# Patient Record
Sex: Female | Born: 1990
Health system: Southern US, Community
[De-identification: ages and names within clinical notes are randomized; demographics above are authoritative.]

## PROBLEM LIST (undated history)

## (undated) DIAGNOSIS — O24419 Gestational diabetes mellitus in pregnancy, unspecified control: Secondary | ICD-10-CM

## (undated) DIAGNOSIS — F1110 Opioid abuse, uncomplicated: Secondary | ICD-10-CM

## (undated) DIAGNOSIS — C719 Malignant neoplasm of brain, unspecified: Secondary | ICD-10-CM

## (undated) HISTORY — DX: Opioid abuse, uncomplicated: F11.10

## (undated) HISTORY — PX: APPENDECTOMY: SHX54

## (undated) HISTORY — PX: OTHER SURGICAL HISTORY: SHX169

---

## 2016-06-21 DIAGNOSIS — R69 Illness, unspecified: Secondary | ICD-10-CM | POA: Diagnosis not present

## 2016-06-21 DIAGNOSIS — C719 Malignant neoplasm of brain, unspecified: Secondary | ICD-10-CM | POA: Diagnosis not present

## 2016-06-21 DIAGNOSIS — F909 Attention-deficit hyperactivity disorder, unspecified type: Secondary | ICD-10-CM | POA: Diagnosis not present

## 2016-06-22 DIAGNOSIS — Z79899 Other long term (current) drug therapy: Secondary | ICD-10-CM | POA: Diagnosis not present

## 2016-06-22 DIAGNOSIS — Z5181 Encounter for therapeutic drug level monitoring: Secondary | ICD-10-CM | POA: Diagnosis not present

## 2016-06-22 DIAGNOSIS — Z79891 Long term (current) use of opiate analgesic: Secondary | ICD-10-CM | POA: Diagnosis not present

## 2016-07-28 DIAGNOSIS — F419 Anxiety disorder, unspecified: Secondary | ICD-10-CM | POA: Diagnosis not present

## 2016-07-28 DIAGNOSIS — C719 Malignant neoplasm of brain, unspecified: Secondary | ICD-10-CM | POA: Diagnosis not present

## 2016-07-28 DIAGNOSIS — R69 Illness, unspecified: Secondary | ICD-10-CM | POA: Diagnosis not present

## 2016-08-24 DIAGNOSIS — K0252 Dental caries on pit and fissure surface penetrating into dentin: Secondary | ICD-10-CM | POA: Diagnosis not present

## 2016-08-24 DIAGNOSIS — K122 Cellulitis and abscess of mouth: Secondary | ICD-10-CM | POA: Diagnosis not present

## 2016-08-30 DIAGNOSIS — C719 Malignant neoplasm of brain, unspecified: Secondary | ICD-10-CM | POA: Diagnosis not present

## 2016-08-30 DIAGNOSIS — Z23 Encounter for immunization: Secondary | ICD-10-CM | POA: Diagnosis not present

## 2016-08-30 DIAGNOSIS — R69 Illness, unspecified: Secondary | ICD-10-CM | POA: Diagnosis not present

## 2016-09-30 DIAGNOSIS — R69 Illness, unspecified: Secondary | ICD-10-CM | POA: Diagnosis not present

## 2016-09-30 DIAGNOSIS — F909 Attention-deficit hyperactivity disorder, unspecified type: Secondary | ICD-10-CM | POA: Diagnosis not present

## 2016-09-30 DIAGNOSIS — C719 Malignant neoplasm of brain, unspecified: Secondary | ICD-10-CM | POA: Diagnosis not present

## 2016-11-22 DIAGNOSIS — L02211 Cutaneous abscess of abdominal wall: Secondary | ICD-10-CM | POA: Insufficient documentation

## 2016-11-22 DIAGNOSIS — F17210 Nicotine dependence, cigarettes, uncomplicated: Secondary | ICD-10-CM | POA: Insufficient documentation

## 2016-11-22 DIAGNOSIS — F1910 Other psychoactive substance abuse, uncomplicated: Secondary | ICD-10-CM | POA: Insufficient documentation

## 2016-11-23 ENCOUNTER — Emergency Department (HOSPITAL_COMMUNITY)
Admission: EM | Admit: 2016-11-23 | Discharge: 2016-11-23 | Disposition: A | Discharge status: Home / Self Care | Payer: Self-pay | Attending: Emergency Medicine | Admitting: Emergency Medicine

## 2016-11-23 ENCOUNTER — Encounter (HOSPITAL_COMMUNITY): Payer: Self-pay | Admitting: *Deleted

## 2016-11-23 DIAGNOSIS — L0291 Cutaneous abscess, unspecified: Secondary | ICD-10-CM

## 2016-11-23 HISTORY — DX: Malignant neoplasm of brain, unspecified: C71.9

## 2016-11-23 MED ORDER — OXYCODONE-ACETAMINOPHEN 5-325 MG PO TABS
1.0000 | ORAL_TABLET | ORAL | Status: DC | PRN
  Administered 2016-11-23: 1 via ORAL
  Filled 2016-11-23: qty 1

## 2016-11-23 MED ORDER — SULFAMETHOXAZOLE-TRIMETHOPRIM 800-160 MG PO TABS: 1.0000 | ORAL_TABLET | Freq: Two times a day (BID) | ORAL | 0 refills | Status: AC

## 2016-11-23 MED ORDER — BACITRACIN ZINC 500 UNIT/GM EX OINT
TOPICAL_OINTMENT | CUTANEOUS | Status: AC
  Filled 2016-11-23: qty 2.7

## 2016-11-23 MED ORDER — CEPHALEXIN 500 MG PO CAPS: 500.0000 mg | ORAL_CAPSULE | Freq: Four times a day (QID) | ORAL | 0 refills | Status: DC

## 2016-11-23 MED ORDER — LIDOCAINE HCL (PF) 1 % IJ SOLN
INTRAMUSCULAR | Status: AC
  Filled 2016-11-23: qty 5

## 2016-11-23 NOTE — ED Notes (Signed)
Pt also has a reddened area on her anterior FA.  Pt states it's from injecting.  She then reports she did the same thing in her LLQ where the abscess is.

## 2016-11-23 NOTE — Discharge Instructions (Signed)
Take the prescribed medication as directed.  Do warm compresses/soaks at home. Follow-up with the wellness clinic if you do not have a primary care doctor. Return to the ED for new or worsening symptoms.

## 2016-11-23 NOTE — ED Notes (Signed)
Pain medication given in Triage. Patient advised about side effects of medications and  to avoid driving for a minimum of 4 hours.  

## 2016-11-23 NOTE — ED Provider Notes (Signed)
[place] DEPT Provider Note   CSN: [id number] Arrival date & time: 11/22/16  2311     History   Chief Complaint Chief Complaint  Patient presents with  . Abscess    HPI Cindy Watts is a 26 y.o. female.  The history is provided by the patient and medical records.  Abscess     26 y.o. F here with abscess to LLQ.  States has been there for a few days now after injecting drugs.  Has hx of same.  Reports subjective fever, afebrile here.  Also notes some redness to left forearm from same.  No intervention tried at home.  Past Medical History:  Diagnosis Date  . Astrocytoma, grade II ([place])     There are no active problems to display for this patient.   Past Surgical History:  Procedure Laterality Date  . APPENDECTOMY    . brain surgeries      OB History    No data available       Home Medications    Prior to Admission medications   Not on File    Family History No family history on file.  Social History Social History   Tobacco Use  . Smoking status: Current Every Day Smoker    Packs/day: 0.50    Types: Cigarettes  . Smokeless tobacco: Never Used  Substance Use Topics  . Alcohol use: No    Frequency: Never  . Drug use: Yes    Types: Cocaine, Marijuana     Allergies   Patient has no known allergies.   Review of Systems Review of Systems  Skin:       Abscess  All other systems reviewed and are negative.    Physical Exam Updated Vital Signs BP 104/72 (BP Location: Right Arm)   Pulse 84   Temp 98.2 F (36.8 C) (Oral)   Resp 16   Ht 5\' 2"  (1.575 m)   Wt 52.2 kg (115 lb)   LMP 11/09/2016   SpO2 98%   BMI 21.03 kg/m   Physical Exam  Constitutional: She is oriented to person, place, and time. She appears well-developed and well-nourished.  HENT:  Head: Normocephalic and atraumatic.  Mouth/Throat: Oropharynx is clear and moist.  Eyes: Conjunctivae and EOM are normal. Pupils are equal, round,  and reactive to light.  Neck: Normal range of motion.  Cardiovascular: Normal rate, regular rhythm and normal heart sounds.  Pulmonary/Chest: Effort normal and breath sounds normal. No stridor. No respiratory distress.  Abdominal: Soft. Bowel sounds are normal.  3cm abscess to left lower abdominal pain, surrounding erythema without induration; abscess is fluctuant without active drainage  Musculoskeletal: Normal range of motion.  Small amount of erythema to the ulnar aspect of left forearm, there is no appreciable abscess, induration, or lymphangitis; there is a small scab noted, no active bleeding or drainage  Neurological: She is alert and oriented to person, place, and time.  Skin: Skin is warm and dry.  Psychiatric: She has a normal mood and affect.  Nursing note and vitals reviewed.    ED Treatments / Results  Labs (all labs ordered are listed, but only abnormal results are displayed) Labs Reviewed - No data to display  EKG  EKG Interpretation None       Radiology No results found.  Procedures Procedures (including critical care time)  INCISION AND DRAINAGE Performed by: Larene Pickett Consent: Verbal consent obtained. Risks and benefits: risks, benefits and alternatives were discussed Type:  abscess  Body area: left lower abdomen  Anesthesia: local infiltration  Incision was made with a scalpel.  Local anesthetic: lidocaine 1% without epinephrine  Anesthetic total: 3 ml  Complexity: complex Blunt dissection to break up loculations  Drainage: purulent, foul smelling  Drainage amount: large  Packing material: none (patient would not tolerate)  Patient tolerance: Patient tolerated the procedure well with no immediate complications.     Medications Ordered in ED Medications  oxyCODONE-acetaminophen (PERCOCET/ROXICET) 5-325 MG per tablet 1 tablet (1 tablet Oral Given 11/23/16 0107)  lidocaine (PF) (XYLOCAINE) 1 % injection (not administered)    bacitracin 500 UNIT/GM ointment (not administered)     Initial Impression / Assessment and Plan / ED Course  I have reviewed the triage vital signs and the nursing notes.  Pertinent labs & imaging results that were available during my care of the patient were reviewed by me and considered in my medical decision making (see chart for details).  27 year old female here with abscess of left lower abdomen from IV drug use.  Reports subjective fever but afebrile and nontoxic in appearance here.  She has no systemic symptoms concerning for sepsis.  No murmur heard on exam.  I&D performed, patient did not tolerate all that well and the procedure was stopped early.  There was a large amount of pus expressed but some still present within, patient would not allow packing.  Give her history of IVDU, will start on abx.  Discussed home wound care, warm compresses.  I did discuss the harmful effects of IVDU on her body including worsening infection, she acknowledged these risks.  Patient was discharged home in stable condition.  Final Clinical Impressions(s) / ED Diagnoses   Final diagnoses:  Abscess    ED Discharge Orders        Ordered    sulfamethoxazole-trimethoprim (BACTRIM DS,SEPTRA DS) 800-160 MG tablet  2 times daily     11/23/16 0515    cephALEXin (KEFLEX) 500 MG capsule  4 times daily     11/23/16 0515       Larene Pickett, PA-C 11/23/16 3893    Randal Buba, April, MD 11/23/16 [phone]

## 2018-02-10 ENCOUNTER — Emergency Department (HOSPITAL_COMMUNITY)
Admission: EM | Admit: 2018-02-10 | Discharge: 2018-02-10 | Disposition: A | Discharge status: Home / Self Care | Payer: Medicare HMO | Attending: Emergency Medicine | Admitting: Emergency Medicine

## 2018-02-10 ENCOUNTER — Encounter (HOSPITAL_COMMUNITY): Payer: Self-pay | Admitting: *Deleted

## 2018-02-10 ENCOUNTER — Emergency Department (HOSPITAL_COMMUNITY): Payer: Medicare HMO

## 2018-02-10 DIAGNOSIS — R509 Fever, unspecified: Secondary | ICD-10-CM | POA: Diagnosis present

## 2018-02-10 DIAGNOSIS — J101 Influenza due to other identified influenza virus with other respiratory manifestations: Secondary | ICD-10-CM | POA: Diagnosis not present

## 2018-02-10 DIAGNOSIS — F17210 Nicotine dependence, cigarettes, uncomplicated: Secondary | ICD-10-CM | POA: Insufficient documentation

## 2018-02-10 LAB — INFLUENZA PANEL BY PCR (TYPE A & B)
INFLAPCR: POSITIVE — AB
INFLBPCR: NEGATIVE

## 2018-02-10 LAB — GROUP A STREP BY PCR: GROUP A STREP BY PCR: NOT DETECTED

## 2018-02-10 MED ORDER — HYDROCOD POLST-CPM POLST ER 10-8 MG/5ML PO SUER
5.0000 mL | Freq: Once | ORAL | Status: AC
  Administered 2018-02-10: 5 mL via ORAL
  Filled 2018-02-10: qty 5

## 2018-02-10 MED ORDER — HYDROCOD POLST-CPM POLST ER 10-8 MG/5ML PO SUER: 5.0000 mL | Freq: Two times a day (BID) | ORAL | 0 refills | Status: DC | PRN

## 2018-02-10 NOTE — ED Notes (Signed)
Bed: WTR7 Expected date:  Expected time:  Means of arrival:  Comments: 

## 2018-02-10 NOTE — ED Triage Notes (Signed)
Pt complains of sore throat, cough, nasal congestion, body aches x 4 days. Pt returned from [place] last night.

## 2018-02-10 NOTE — ED Provider Notes (Signed)
[facility] DEPT Provider Note   CSN: [id number] Arrival date & time: 02/10/18  1526     History   Chief Complaint Chief Complaint  Patient presents with  . URI    HPI Cindy Watts is a 28 y.o. female every day smoker with hx of Astrocytoma  who presents to the ED with flu like symptoms. Patient reports fever, chills, cough, congestion and body aches x 4 days. Taking OTC medications without relief.   The history is provided by the patient. No language interpreter was used.  URI  Presenting symptoms: congestion, cough, ear pain, fatigue, fever and sore throat   Presenting symptoms: no facial pain   Severity:  Moderate Onset quality:  Gradual Duration:  4 days Timing:  Constant Progression:  Worsening Chronicity:  New Relieved by:  Nothing Worsened by:  Nothing Ineffective treatments:  OTC medications Associated symptoms: myalgias   Associated symptoms: no headaches and no wheezing   Risk factors: sick contacts     Past Medical History:  Diagnosis Date  . Astrocytoma, grade II ([place])     There are no active problems to display for this patient.   Past Surgical History:  Procedure Laterality Date  . APPENDECTOMY    . brain surgeries       OB History    Gravida  1   Para      Term      Preterm      AB      Living        SAB      TAB      Ectopic      Multiple      Live Births               Home Medications    Prior to Admission medications   Medication Sig Start Date End Date Taking? Authorizing Provider  chlorpheniramine-HYDROcodone (TUSSIONEX PENNKINETIC ER) 10-8 MG/5ML SUER Take 5 mLs by mouth every 12 (twelve) hours as needed for cough. 02/10/18   Ashley Murrain, NP    Family History No family history on file.  Social History Social History   Tobacco Use  . Smoking status: Current Every Day Smoker    Packs/day: 0.50    Types: Cigarettes  . Smokeless tobacco: Never Used  Substance Use  Topics  . Alcohol use: No    Frequency: Never  . Drug use: Yes    Types: Cocaine, Marijuana     Allergies   Patient has no known allergies.   Review of Systems Review of Systems  Constitutional: Positive for fatigue and fever.  HENT: Positive for congestion, ear pain, sinus pressure and sore throat.   Eyes: Negative for discharge, redness and itching.  Respiratory: Positive for cough. Negative for shortness of breath and wheezing.   Cardiovascular: Negative for chest pain.  Gastrointestinal: Negative for abdominal pain, diarrhea, nausea and vomiting.  Genitourinary: Positive for decreased urine volume. Negative for dysuria.  Musculoskeletal: Positive for myalgias.  Skin: Negative for rash.  Neurological: Negative for headaches.  Psychiatric/Behavioral: Negative for confusion.     Physical Exam Updated Vital Signs BP 105/74   Pulse 86   Temp 98.5 F (36.9 C) (Oral)   Resp 18   LMP 12/15/2017   SpO2 97%   Physical Exam Vitals signs and nursing note reviewed.  Constitutional:      General: She is not in acute distress.    Appearance: Normal appearance. She is well-developed.  HENT:  Head: Normocephalic and atraumatic.     Right Ear: Tympanic membrane normal.     Left Ear: Tympanic membrane normal.     Nose: Congestion present.     Mouth/Throat:     Pharynx: Posterior oropharyngeal erythema present.  Eyes:     Extraocular Movements: Extraocular movements intact.     Conjunctiva/sclera: Conjunctivae normal.  Neck:     Musculoskeletal: Normal range of motion and neck supple. No neck rigidity.  Cardiovascular:     Rate and Rhythm: Normal rate and regular rhythm.  Pulmonary:     Effort: Pulmonary effort is normal. No respiratory distress.     Breath sounds: No wheezing or rales.  Abdominal:     Palpations: Abdomen is soft.     Tenderness: There is no abdominal tenderness.  Musculoskeletal: Normal range of motion.  Lymphadenopathy:     Cervical: No cervical  adenopathy.  Skin:    General: Skin is warm and dry.  Neurological:     Mental Status: She is alert and oriented to person, place, and time.     Cranial Nerves: No cranial nerve deficit.  Psychiatric:        Mood and Affect: Mood normal.      ED Treatments / Results  Labs (all labs ordered are listed, but only abnormal results are displayed) Labs Reviewed  INFLUENZA PANEL BY PCR (TYPE A & B) - Abnormal; Notable for the following components:      Result Value   Influenza A By PCR POSITIVE (*)    All other components within normal limits  GROUP A STREP BY PCR   Radiology Dg Chest 2 View  Result Date: 02/10/2018 CLINICAL DATA:  Cough, fever and body aches for 4 days. EXAM: CHEST - 2 VIEW COMPARISON:  None. FINDINGS: The cardiomediastinal silhouette is unremarkable. There is no evidence of focal airspace disease, pulmonary edema, suspicious pulmonary nodule/mass, pleural effusion, or pneumothorax. No acute bony abnormalities are identified. IMPRESSION: No active cardiopulmonary disease. Electronically Signed   By: Margarette Canada M.D.   On: 02/10/2018 16:59    Procedures Procedures (including critical care time)  Medications Ordered in ED Medications  chlorpheniramine-HYDROcodone (TUSSIONEX) 10-8 MG/5ML suspension 5 mL (5 mLs Oral Given 02/10/18 1803)     Initial Impression / Assessment and Plan / ED Course  I have reviewed the triage vital signs and the nursing notes. SUBJECTIVE:  Cindy Watts is a 28 y.o. female who present complaining of flu-like symptoms: fevers, chills, myalgias, congestion, sore throat and cough for 4 days. Denies dyspnea or wheezing.  OBJECTIVE: Appears moderately ill but not toxic; temperature as noted in vitals. Ears normal. Throat and pharynx normal.  Neck supple. No adenopathy in the neck. Sinuses non tender. The chest is clear.  ASSESSMENT: Influenza A  PLAN: Symptomatic therapy suggested: rest, increase fluids, gargle prn for sore throat,  tylenol and motrin,use mist of vaporizer prn. Return precautions discussed. Will Rx Tussionex for cough. Will not start Tamiflu since symptoms started 4 days ago. Patient to f/u with PCP or return here for worsening symptoms.   Final Clinical Impressions(s) / ED Diagnoses   Final diagnoses:  Influenza A    ED Discharge Orders         Ordered    chlorpheniramine-HYDROcodone (TUSSIONEX PENNKINETIC ER) 10-8 MG/5ML SUER  Every 12 hours PRN     02/10/18 [place], [place], NP 02/10/18 1831    Tegeler, Gwenyth Allegra,  MD 02/10/18 2326

## 2018-02-10 NOTE — Discharge Instructions (Addendum)
Take tylenol and ibuprofen as needed for fever and body aches. Do not drive while taking the cough medication as it will make you sleepy. Rest, drink plenty of fluids so you do not get dehydrated. Return for worsening symptoms.

## 2018-05-17 ENCOUNTER — Encounter: Payer: Self-pay | Admitting: Certified Nurse Midwife

## 2018-05-17 ENCOUNTER — Other Ambulatory Visit (HOSPITAL_COMMUNITY)
Admission: RE | Admit: 2018-05-17 | Discharge: 2018-05-17 | Disposition: A | Discharge status: Home / Self Care | Payer: Medicare PPO | Source: Ambulatory Visit | Attending: Certified Nurse Midwife | Admitting: Certified Nurse Midwife

## 2018-05-17 ENCOUNTER — Other Ambulatory Visit: Payer: Self-pay

## 2018-05-17 ENCOUNTER — Ambulatory Visit (INDEPENDENT_AMBULATORY_CARE_PROVIDER_SITE_OTHER): Payer: Medicare PPO | Admitting: Certified Nurse Midwife

## 2018-05-17 VITALS — BP 108/67 | HR 83 | Wt 125.8 lb

## 2018-05-17 DIAGNOSIS — O0930 Supervision of pregnancy with insufficient antenatal care, unspecified trimester: Secondary | ICD-10-CM

## 2018-05-17 DIAGNOSIS — O99330 Smoking (tobacco) complicating pregnancy, unspecified trimester: Secondary | ICD-10-CM

## 2018-05-17 DIAGNOSIS — N898 Other specified noninflammatory disorders of vagina: Secondary | ICD-10-CM

## 2018-05-17 DIAGNOSIS — Z3480 Encounter for supervision of other normal pregnancy, unspecified trimester: Secondary | ICD-10-CM | POA: Diagnosis present

## 2018-05-17 DIAGNOSIS — Z3A22 22 weeks gestation of pregnancy: Secondary | ICD-10-CM

## 2018-05-17 DIAGNOSIS — O26892 Other specified pregnancy related conditions, second trimester: Secondary | ICD-10-CM

## 2018-05-17 NOTE — Progress Notes (Signed)
Pt presents for NOB visit transferring from [facility] This is planned pregnancy and FOB is involved.  Needs PN labs done today. Pap completed 02/2018.

## 2018-05-17 NOTE — Patient Instructions (Signed)
Glucose Tolerance Test During Pregnancy Why am I having this test? The glucose tolerance test (GTT) is done to check how your body processes sugar (glucose). This is one of several tests used to diagnose diabetes that develops during pregnancy (gestational diabetes mellitus). Gestational diabetes is a temporary form of diabetes that some women develop during pregnancy. It usually occurs during the second trimester of pregnancy and goes away after delivery. Testing (screening) for gestational diabetes usually occurs between 24 and 28 weeks of pregnancy. You may have the GTT test after having a 1-hour glucose screening test if the results from that test indicate that you may have gestational diabetes. You may also have this test if:  You have a history of gestational diabetes.  You have a history of giving birth to very large babies or have experienced repeated fetal loss (stillbirth).  You have signs and symptoms of diabetes, such as: ? Changes in your vision. ? Tingling or numbness in your hands or feet. ? Changes in hunger, thirst, and urination that are not otherwise explained by your pregnancy. What is being tested? This test measures the amount of glucose in your blood at different times during a period of 3 hours. This indicates how well your body is able to process glucose. What kind of sample is taken?  Blood samples are required for this test. They are usually collected by inserting a needle into a blood vessel. How do I prepare for this test?  For 3 days before your test, eat normally. Have plenty of carbohydrate-rich foods.  Follow instructions from your health care provider about: ? Eating or drinking restrictions on the day of the test. You may be asked to not eat or drink anything other than water (fast) starting 8-10 hours before the test. ? Changing or stopping your regular medicines. Some medicines may interfere with this test. Tell a health care provider about:  All  medicines you are taking, including vitamins, herbs, eye drops, creams, and over-the-counter medicines.  Any blood disorders you have.  Any surgeries you have had.  Any medical conditions you have. What happens during the test? First, your blood glucose will be measured. This is referred to as your fasting blood glucose, since you fasted before the test. Then, you will drink a glucose solution that contains a certain amount of glucose. Your blood glucose will be measured again 1, 2, and 3 hours after drinking the solution. This test takes about 3 hours to complete. You will need to stay at the testing location during this time. During the testing period:  Do not eat or drink anything other than the glucose solution.  Do not exercise.  Do not use any products that contain nicotine or tobacco, such as cigarettes and e-cigarettes. If you need help stopping, ask your health care provider. The testing procedure may vary among health care providers and hospitals. How are the results reported? Your results will be reported as milligrams of glucose per deciliter of blood (mg/dL) or millimoles per liter (mmol/L). Your health care provider will compare your results to normal ranges that were established after testing a large group of people (reference ranges). Reference ranges may vary among labs and hospitals. For this test, common reference ranges are:  Fasting: less than 95-105 mg/dL (5.3-5.8 mmol/L).  1 hour after drinking glucose: less than 180-190 mg/dL (10.0-10.5 mmol/L).  2 hours after drinking glucose: less than 155-165 mg/dL (8.6-9.2 mmol/L).  3 hours after drinking glucose: 140-145 mg/dL (7.8-8.1 mmol/L). What do the   results mean? Results within reference ranges are considered normal, meaning that your glucose levels are well-controlled. If two or more of your blood glucose levels are high, you may be diagnosed with gestational diabetes. If only one level is high, your health care  provider may suggest repeat testing or other tests to confirm a diagnosis. Talk with your health care provider about what your results mean. Questions to ask your health care provider Ask your health care provider, or the department that is doing the test:  When will my results be ready?  How will I get my results?  What are my treatment options?  What other tests do I need?  What are my next steps? Summary  The glucose tolerance test (GTT) is one of several tests used to diagnose diabetes that develops during pregnancy (gestational diabetes mellitus). Gestational diabetes is a temporary form of diabetes that some women develop during pregnancy.  You may have the GTT test after having a 1-hour glucose screening test if the results from that test indicate that you may have gestational diabetes. You may also have this test if you have any symptoms or risk factors for gestational diabetes.  Talk with your health care provider about what your results mean. This information is not intended to replace advice given to you by your health care provider. Make sure you discuss any questions you have with your health care provider. Document Released: 06/28/2011 Document Revised: 08/08/2016 Document Reviewed: 08/08/2016 Elsevier Interactive Patient Education  2019 Elsevier Inc.  

## 2018-05-17 NOTE — Progress Notes (Signed)
History:   Cindy Watts is a 28 y.o. [phone] at [redacted]w[redacted]d by LMP being seen today for her first obstetrical visit.  Her obstetrical history is significant for C/S and late to prenatal care. Patient does intend to breast feed and formula feed. Pregnancy history fully reviewed.  Patient reports vaginal discharge. Patient reports white thin discharge with an odor, unsure if she has a vaginal infection or UTI.       HISTORY: OB History  Gravida Para Term Preterm AB Living  4 3 3  0 0 3  SAB TAB Ectopic Multiple Live Births  0 0 0 0 3    # Outcome Date GA Lbr Len/2nd Weight Sex Delivery Anes PTL Lv  4 Current           3 Term 10/22/08 [redacted]w[redacted]d   F CS-LTranv   LIV  2 Term 10/01/07 [redacted]w[redacted]d   M CS-LTranv EPI  LIV  1 Term 12/11/05 [redacted]w[redacted]d   F CS-LTranv Spinal  LIV     Complications: Anesthetic Complications    Last pap smear was done 02/11/18 and was normal  Past Medical History:  Diagnosis Date  . Astrocytoma, grade II [facility])    Past Surgical History:  Procedure Laterality Date  . APPENDECTOMY    . brain surgeries     x3   History reviewed. No pertinent family history. Social History   Tobacco Use  . Smoking status: Current Every Day Smoker    Packs/day: 0.50    Types: Cigarettes  . Smokeless tobacco: Never Used  Substance Use Topics  . Alcohol use: No    Frequency: Never  . Drug use: Not Currently    Types: Cocaine, Marijuana   No Known Allergies Current Outpatient Medications on File Prior to Visit  Medication Sig Dispense Refill  . Prenatal Vit w/Fe-Methylfol-FA (PNV PO) Take by mouth.    . chlorpheniramine-HYDROcodone (TUSSIONEX PENNKINETIC ER) 10-8 MG/5ML SUER Take 5 mLs by mouth every 12 (twelve) hours as needed for cough. 115 mL 0   No current facility-administered medications on file prior to visit.     Review of Systems Pertinent items noted in HPI and remainder of comprehensive ROS otherwise negative. Physical Exam:   Vitals:   05/17/18 1508  BP: 108/67   Pulse: 83  Weight: 125 lb 12.8 oz (57.1 kg)   Fetal Heart Rate (bpm): 151 Uterus:  Fundal Height: 23 cm  Pelvic Exam: Perineum: no hemorrhoids, normal perineum   Vulva: normal external genitalia, no lesions   Vagina:  normal mucosa, normal discharge on vaginal introitus    Bony Pelvis: average  System: General: well-developed, well-nourished female in no acute distress   Skin: normal coloration and turgor, no rashes   Neurologic: oriented, normal, negative, normal mood   Extremities: normal strength, tone, and muscle mass, ROM of all joints is normal   HEENT PERRLA, extraocular movement intact and sclera clear   Mouth/Teeth mucous membranes moist, pharynx normal without lesions    Neck supple and no masses   Cardiovascular: regular rate and rhythm   Respiratory:  no respiratory distress, normal breath sounds   Abdomen: soft, non-tender; gravid appropriate for gestational age      Assessment:    Pregnancy: [id number] Patient Active Problem List   Diagnosis Date Noted  . Supervision of other normal pregnancy, antepartum 05/17/2018  . Late prenatal care, antepartum 05/17/2018  . Tobacco use during pregnancy, antepartum 05/17/2018     Plan:    1. Supervision of other normal  pregnancy, antepartum - Routine prenatal care  - Anticipatory guidance on upcoming appointments with next appointment being GTT, discussed fasting prior to appointment  - Babyscripts Schedule Optimization - Obstetric Panel, Including HIV - Culture, OB Urine - Cervicovaginal ancillary only( [place]) - [place] MFM OB COMP + 14 WK; Future - Pain Mgmt, Profile 6 Conf w/o mM, U  2. Late prenatal care, antepartum - Care initiated at 22 weeks   3. Tobacco use during pregnancy, antepartum - Reports everyday smoker, denies use of marijuana  - Educated and discussed risk of smoking during pregnancy and risks to fetus including IUGR and/or fetal demise   Initial labs drawn. Continue prenatal vitamins. Genetic  Screening discussed, NIPS: undecided. Ultrasound discussed; fetal anatomic survey: ordered. Problem list reviewed and updated. The nature of [facility] with multiple MDs and other Advanced Practice Providers was explained to patient; also emphasized that residents, students are part of our team. Routine obstetric precautions reviewed. Return in about 5 weeks (around 06/21/2018) for ROB/2hrGTT.     Lajean Manes, [place] for [employer], [place]

## 2018-05-18 LAB — CERVICOVAGINAL ANCILLARY ONLY
Bacterial vaginitis: POSITIVE — AB
Candida vaginitis: POSITIVE — AB
Chlamydia: NEGATIVE
Neisseria Gonorrhea: NEGATIVE
Trichomonas: NEGATIVE

## 2018-05-21 MED ORDER — TERCONAZOLE 0.8 % VA CREA: 1.0000 | TOPICAL_CREAM | Freq: Every day | VAGINAL | 0 refills | Status: AC

## 2018-05-21 MED ORDER — METRONIDAZOLE 500 MG PO TABS: 500.0000 mg | ORAL_TABLET | Freq: Two times a day (BID) | ORAL | 0 refills | Status: AC

## 2018-05-21 NOTE — Addendum Note (Signed)
Addended by: Lajean Manes on: 05/21/2018 09:38 AM   Modules accepted: Orders

## 2018-05-23 ENCOUNTER — Other Ambulatory Visit: Payer: Self-pay

## 2018-05-23 ENCOUNTER — Other Ambulatory Visit: Payer: Medicare PPO

## 2018-05-23 DIAGNOSIS — Z3480 Encounter for supervision of other normal pregnancy, unspecified trimester: Secondary | ICD-10-CM

## 2018-05-24 ENCOUNTER — Ambulatory Visit (HOSPITAL_COMMUNITY)
Admission: RE | Admit: 2018-05-24 | Discharge: 2018-05-24 | Disposition: A | Discharge status: Home / Self Care | Payer: Medicare PPO | Source: Ambulatory Visit | Attending: Obstetrics and Gynecology | Admitting: Obstetrics and Gynecology

## 2018-05-24 DIAGNOSIS — O34219 Maternal care for unspecified type scar from previous cesarean delivery: Secondary | ICD-10-CM | POA: Diagnosis not present

## 2018-05-24 DIAGNOSIS — Z3480 Encounter for supervision of other normal pregnancy, unspecified trimester: Secondary | ICD-10-CM | POA: Diagnosis not present

## 2018-05-24 DIAGNOSIS — Z3A23 23 weeks gestation of pregnancy: Secondary | ICD-10-CM | POA: Diagnosis not present

## 2018-05-24 DIAGNOSIS — Z363 Encounter for antenatal screening for malformations: Secondary | ICD-10-CM | POA: Diagnosis not present

## 2018-05-25 ENCOUNTER — Other Ambulatory Visit (HOSPITAL_COMMUNITY): Payer: Self-pay | Admitting: *Deleted

## 2018-05-25 DIAGNOSIS — O43199 Other malformation of placenta, unspecified trimester: Secondary | ICD-10-CM

## 2018-05-28 ENCOUNTER — Other Ambulatory Visit: Payer: Self-pay | Admitting: Certified Nurse Midwife

## 2018-05-28 DIAGNOSIS — Z3480 Encounter for supervision of other normal pregnancy, unspecified trimester: Secondary | ICD-10-CM

## 2018-05-28 DIAGNOSIS — D509 Iron deficiency anemia, unspecified: Secondary | ICD-10-CM

## 2018-05-29 LAB — OBSTETRIC PANEL, INCLUDING HIV
Basophils Absolute: 0 10*3/uL (ref 0.0–0.2)
Basos: 0 %
EOS (ABSOLUTE): 0.1 10*3/uL (ref 0.0–0.4)
Eos: 1 %
HIV Screen 4th Generation wRfx: NONREACTIVE
Hematocrit: 28.3 % — ABNORMAL LOW (ref 34.0–46.6)
Hemoglobin: 9.7 g/dL — ABNORMAL LOW (ref 11.1–15.9)
Hepatitis B Surface Ag: NEGATIVE
Immature Grans (Abs): 0 10*3/uL (ref 0.0–0.1)
Immature Granulocytes: 0 %
Lymphocytes Absolute: 1.5 10*3/uL (ref 0.7–3.1)
Lymphs: 21 %
MCH: 28.6 pg (ref 26.6–33.0)
MCHC: 34.3 g/dL (ref 31.5–35.7)
MCV: 84 fL (ref 79–97)
Monocytes Absolute: 0.3 10*3/uL (ref 0.1–0.9)
Monocytes: 3 %
Neutrophils Absolute: 5.4 10*3/uL (ref 1.4–7.0)
Neutrophils: 75 %
Platelets: 302 10*3/uL (ref 150–450)
RBC: 3.39 x10E6/uL — ABNORMAL LOW (ref 3.77–5.28)
RDW: 13.5 % (ref 11.7–15.4)
RPR Ser Ql: NONREACTIVE
Rh Factor: POSITIVE
Rubella Antibodies, IGG: <0.9 index — ABNORMAL LOW (ref >0.99)
WBC: 7.3 10*3/uL (ref 3.4–10.8)

## 2018-05-29 LAB — AB SCR+ANTIBODY ID
Antibody Screen: POSITIVE — AB
Coombs Titer #1: 16

## 2018-06-01 DIAGNOSIS — O99019 Anemia complicating pregnancy, unspecified trimester: Secondary | ICD-10-CM | POA: Insufficient documentation

## 2018-06-01 DIAGNOSIS — D509 Iron deficiency anemia, unspecified: Secondary | ICD-10-CM | POA: Insufficient documentation

## 2018-06-01 MED ORDER — FERROUS SULFATE 325 (65 FE) MG PO TABS: 325.0000 mg | ORAL_TABLET | Freq: Two times a day (BID) | ORAL | 2 refills | Status: AC

## 2018-06-01 NOTE — Addendum Note (Signed)
Addended by: Lajean Manes on: 06/01/2018 09:50 AM   Modules accepted: Orders

## 2018-06-10 ENCOUNTER — Encounter: Payer: Self-pay | Admitting: Family Medicine

## 2018-06-10 DIAGNOSIS — O361999 Maternal care for other isoimmunization, unspecified trimester, other fetus: Secondary | ICD-10-CM | POA: Insufficient documentation

## 2018-06-10 DIAGNOSIS — O34219 Maternal care for unspecified type scar from previous cesarean delivery: Secondary | ICD-10-CM | POA: Insufficient documentation

## 2018-06-12 ENCOUNTER — Other Ambulatory Visit: Payer: Self-pay | Admitting: Family Medicine

## 2018-06-12 DIAGNOSIS — O361999 Maternal care for other isoimmunization, unspecified trimester, other fetus: Secondary | ICD-10-CM

## 2018-06-13 ENCOUNTER — Ambulatory Visit (HOSPITAL_COMMUNITY): Payer: Medicare PPO

## 2018-06-14 ENCOUNTER — Ambulatory Visit (HOSPITAL_COMMUNITY)
Admission: RE | Admit: 2018-06-14 | Discharge: 2018-06-14 | Disposition: A | Discharge status: Home / Self Care | Payer: Medicare PPO | Source: Ambulatory Visit | Attending: Family Medicine | Admitting: Family Medicine

## 2018-06-14 ENCOUNTER — Ambulatory Visit (HOSPITAL_COMMUNITY): Payer: Medicare PPO

## 2018-06-14 ENCOUNTER — Other Ambulatory Visit: Payer: Self-pay

## 2018-06-14 DIAGNOSIS — O360920 Maternal care for other rhesus isoimmunization, second trimester, not applicable or unspecified: Secondary | ICD-10-CM

## 2018-06-14 DIAGNOSIS — O99332 Smoking (tobacco) complicating pregnancy, second trimester: Secondary | ICD-10-CM

## 2018-06-14 DIAGNOSIS — O361999 Maternal care for other isoimmunization, unspecified trimester, other fetus: Secondary | ICD-10-CM | POA: Insufficient documentation

## 2018-06-14 DIAGNOSIS — O34219 Maternal care for unspecified type scar from previous cesarean delivery: Secondary | ICD-10-CM | POA: Diagnosis not present

## 2018-06-14 DIAGNOSIS — O99012 Anemia complicating pregnancy, second trimester: Secondary | ICD-10-CM

## 2018-06-14 DIAGNOSIS — O0932 Supervision of pregnancy with insufficient antenatal care, second trimester: Secondary | ICD-10-CM | POA: Diagnosis not present

## 2018-06-14 DIAGNOSIS — Z3A23 23 weeks gestation of pregnancy: Secondary | ICD-10-CM

## 2018-06-15 ENCOUNTER — Other Ambulatory Visit (HOSPITAL_COMMUNITY): Payer: Self-pay | Admitting: *Deleted

## 2018-06-15 DIAGNOSIS — O361920 Maternal care for other isoimmunization, second trimester, not applicable or unspecified: Secondary | ICD-10-CM

## 2018-06-21 ENCOUNTER — Encounter: Payer: Medicare PPO | Admitting: Obstetrics and Gynecology

## 2018-06-21 ENCOUNTER — Ambulatory Visit (HOSPITAL_COMMUNITY): Admission: RE | Admit: 2018-06-21 | Payer: Medicare PPO | Source: Ambulatory Visit

## 2018-06-21 ENCOUNTER — Ambulatory Visit (HOSPITAL_COMMUNITY): Payer: Medicare PPO

## 2018-06-21 ENCOUNTER — Other Ambulatory Visit: Payer: Medicare PPO

## 2018-06-22 ENCOUNTER — Telehealth: Payer: Self-pay | Admitting: Obstetrics and Gynecology

## 2018-06-22 ENCOUNTER — Encounter (HOSPITAL_COMMUNITY): Payer: Self-pay

## 2018-06-22 ENCOUNTER — Ambulatory Visit (HOSPITAL_COMMUNITY): Payer: Medicare PPO

## 2018-06-25 ENCOUNTER — Encounter (HOSPITAL_COMMUNITY): Payer: Self-pay

## 2018-06-25 ENCOUNTER — Ambulatory Visit (HOSPITAL_COMMUNITY): Payer: Medicare PPO

## 2018-06-28 ENCOUNTER — Ambulatory Visit (HOSPITAL_COMMUNITY)
Admission: RE | Admit: 2018-06-28 | Discharge: 2018-06-28 | Disposition: A | Discharge status: Home / Self Care | Payer: Medicare PPO | Source: Ambulatory Visit | Attending: Maternal & Fetal Medicine | Admitting: Maternal & Fetal Medicine

## 2018-06-28 ENCOUNTER — Other Ambulatory Visit (HOSPITAL_COMMUNITY): Payer: Self-pay | Admitting: *Deleted

## 2018-06-28 ENCOUNTER — Other Ambulatory Visit: Payer: Self-pay

## 2018-06-28 DIAGNOSIS — O361920 Maternal care for other isoimmunization, second trimester, not applicable or unspecified: Secondary | ICD-10-CM | POA: Insufficient documentation

## 2018-06-28 DIAGNOSIS — O0933 Supervision of pregnancy with insufficient antenatal care, third trimester: Secondary | ICD-10-CM

## 2018-06-28 DIAGNOSIS — O99013 Anemia complicating pregnancy, third trimester: Secondary | ICD-10-CM | POA: Diagnosis not present

## 2018-06-28 DIAGNOSIS — O34219 Maternal care for unspecified type scar from previous cesarean delivery: Secondary | ICD-10-CM

## 2018-06-28 DIAGNOSIS — Z3A28 28 weeks gestation of pregnancy: Secondary | ICD-10-CM

## 2018-06-28 DIAGNOSIS — O43199 Other malformation of placenta, unspecified trimester: Secondary | ICD-10-CM | POA: Diagnosis not present

## 2018-06-28 DIAGNOSIS — Z362 Encounter for other antenatal screening follow-up: Secondary | ICD-10-CM

## 2018-06-28 DIAGNOSIS — O99333 Smoking (tobacco) complicating pregnancy, third trimester: Secondary | ICD-10-CM

## 2018-06-28 DIAGNOSIS — R760 Raised antibody titer: Secondary | ICD-10-CM

## 2018-06-28 DIAGNOSIS — O43193 Other malformation of placenta, third trimester: Secondary | ICD-10-CM

## 2018-06-28 DIAGNOSIS — O360930 Maternal care for other rhesus isoimmunization, third trimester, not applicable or unspecified: Secondary | ICD-10-CM

## 2018-07-02 ENCOUNTER — Encounter: Payer: Medicare PPO | Admitting: Obstetrics and Gynecology

## 2018-07-02 ENCOUNTER — Other Ambulatory Visit: Payer: Medicare PPO

## 2018-07-03 ENCOUNTER — Encounter: Payer: Medicare PPO | Admitting: Family Medicine

## 2018-07-03 ENCOUNTER — Other Ambulatory Visit: Payer: Medicare PPO

## 2018-07-10 ENCOUNTER — Other Ambulatory Visit: Payer: Self-pay

## 2018-07-10 ENCOUNTER — Ambulatory Visit (INDEPENDENT_AMBULATORY_CARE_PROVIDER_SITE_OTHER): Payer: Medicare PPO | Admitting: Obstetrics & Gynecology

## 2018-07-10 ENCOUNTER — Other Ambulatory Visit: Payer: Medicare PPO

## 2018-07-10 ENCOUNTER — Encounter (HOSPITAL_COMMUNITY): Payer: Self-pay

## 2018-07-10 VITALS — BP 111/76 | HR 92 | Wt 121.0 lb

## 2018-07-10 DIAGNOSIS — Z23 Encounter for immunization: Secondary | ICD-10-CM

## 2018-07-10 DIAGNOSIS — O24419 Gestational diabetes mellitus in pregnancy, unspecified control: Secondary | ICD-10-CM

## 2018-07-10 DIAGNOSIS — Z3480 Encounter for supervision of other normal pregnancy, unspecified trimester: Secondary | ICD-10-CM

## 2018-07-10 DIAGNOSIS — F1120 Opioid dependence, uncomplicated: Secondary | ICD-10-CM | POA: Insufficient documentation

## 2018-07-10 NOTE — Patient Instructions (Signed)

## 2018-07-10 NOTE — Progress Notes (Signed)
Pt was recently seen at Methadone clinic. Pt states she is on 60mg  daily. Pt would like to discuss use in pregnancy.

## 2018-07-11 DIAGNOSIS — O24419 Gestational diabetes mellitus in pregnancy, unspecified control: Secondary | ICD-10-CM | POA: Insufficient documentation

## 2018-07-11 LAB — GLUCOSE TOLERANCE, 2 HOURS W/ 1HR
Glucose, 1 hour: 171 mg/dL (ref 65–179)
Glucose, 2 hour: 173 mg/dL — ABNORMAL HIGH (ref 65–152)
Glucose, Fasting: 70 mg/dL (ref 65–91)

## 2018-07-12 ENCOUNTER — Encounter (HOSPITAL_COMMUNITY): Payer: Self-pay

## 2018-07-12 ENCOUNTER — Ambulatory Visit (HOSPITAL_COMMUNITY)
Admission: RE | Admit: 2018-07-12 | Discharge: 2018-07-12 | Disposition: A | Discharge status: Home / Self Care | Payer: Medicare PPO | Source: Ambulatory Visit | Attending: Obstetrics and Gynecology | Admitting: Obstetrics and Gynecology

## 2018-07-12 ENCOUNTER — Other Ambulatory Visit: Payer: Self-pay

## 2018-07-12 ENCOUNTER — Ambulatory Visit (HOSPITAL_COMMUNITY): Payer: Medicare PPO

## 2018-07-12 DIAGNOSIS — O24419 Gestational diabetes mellitus in pregnancy, unspecified control: Secondary | ICD-10-CM

## 2018-07-12 LAB — CBC
Hematocrit: 35.6 % (ref 34.0–46.6)
Hemoglobin: 12.1 g/dL (ref 11.1–15.9)
MCH: 29.1 pg (ref 26.6–33.0)
MCHC: 34 g/dL (ref 31.5–35.7)
MCV: 86 fL (ref 79–97)
Platelets: 251 10*3/uL (ref 150–450)
RBC: 4.16 x10E6/uL (ref 3.77–5.28)
RDW: 14.5 % (ref 11.7–15.4)
WBC: 6.7 10*3/uL (ref 3.4–10.8)

## 2018-07-12 LAB — RPR: RPR Ser Ql: NONREACTIVE

## 2018-07-12 LAB — HIV ANTIBODY (ROUTINE TESTING W REFLEX): HIV Screen 4th Generation wRfx: NONREACTIVE

## 2018-07-12 MED ORDER — ACCU-CHEK GUIDE VI STRP: ORAL_STRIP | 12 refills | Status: AC

## 2018-07-12 MED ORDER — ACCU-CHEK FASTCLIX LANCETS MISC: 1.0000 [IU] | Freq: Four times a day (QID) | 12 refills | Status: AC

## 2018-07-12 MED ORDER — ACCU-CHEK NANO SMARTVIEW W/DEVICE KIT: 1.0000 | PACK | 0 refills | Status: AC

## 2018-07-17 ENCOUNTER — Ambulatory Visit (HOSPITAL_COMMUNITY)
Admission: RE | Admit: 2018-07-17 | Discharge: 2018-07-17 | Disposition: A | Discharge status: Home / Self Care | Payer: Medicare PPO | Source: Ambulatory Visit | Attending: Obstetrics and Gynecology | Admitting: Obstetrics and Gynecology

## 2018-07-17 ENCOUNTER — Ambulatory Visit (HOSPITAL_COMMUNITY): Payer: Medicare PPO | Admitting: *Deleted

## 2018-07-17 ENCOUNTER — Encounter (HOSPITAL_COMMUNITY): Payer: Self-pay | Admitting: *Deleted

## 2018-07-17 ENCOUNTER — Other Ambulatory Visit: Payer: Self-pay

## 2018-07-17 DIAGNOSIS — O99013 Anemia complicating pregnancy, third trimester: Secondary | ICD-10-CM | POA: Diagnosis not present

## 2018-07-17 DIAGNOSIS — O99333 Smoking (tobacco) complicating pregnancy, third trimester: Secondary | ICD-10-CM | POA: Diagnosis not present

## 2018-07-17 DIAGNOSIS — O0933 Supervision of pregnancy with insufficient antenatal care, third trimester: Secondary | ICD-10-CM

## 2018-07-17 DIAGNOSIS — O360930 Maternal care for other rhesus isoimmunization, third trimester, not applicable or unspecified: Secondary | ICD-10-CM

## 2018-07-17 DIAGNOSIS — O99019 Anemia complicating pregnancy, unspecified trimester: Secondary | ICD-10-CM | POA: Diagnosis present

## 2018-07-17 DIAGNOSIS — D509 Iron deficiency anemia, unspecified: Secondary | ICD-10-CM

## 2018-07-17 DIAGNOSIS — R760 Raised antibody titer: Secondary | ICD-10-CM | POA: Diagnosis not present

## 2018-07-17 DIAGNOSIS — O34219 Maternal care for unspecified type scar from previous cesarean delivery: Secondary | ICD-10-CM

## 2018-07-17 DIAGNOSIS — O361999 Maternal care for other isoimmunization, unspecified trimester, other fetus: Secondary | ICD-10-CM | POA: Insufficient documentation

## 2018-07-17 DIAGNOSIS — Z3A31 31 weeks gestation of pregnancy: Secondary | ICD-10-CM

## 2018-07-24 ENCOUNTER — Ambulatory Visit (INDEPENDENT_AMBULATORY_CARE_PROVIDER_SITE_OTHER): Payer: Medicare PPO | Admitting: Obstetrics & Gynecology

## 2018-07-24 ENCOUNTER — Encounter: Payer: Self-pay | Admitting: Obstetrics & Gynecology

## 2018-07-24 DIAGNOSIS — Z3A32 32 weeks gestation of pregnancy: Secondary | ICD-10-CM

## 2018-07-24 DIAGNOSIS — O34219 Maternal care for unspecified type scar from previous cesarean delivery: Secondary | ICD-10-CM

## 2018-07-24 DIAGNOSIS — O0933 Supervision of pregnancy with insufficient antenatal care, third trimester: Secondary | ICD-10-CM

## 2018-07-24 DIAGNOSIS — O24419 Gestational diabetes mellitus in pregnancy, unspecified control: Secondary | ICD-10-CM

## 2018-07-24 DIAGNOSIS — O0930 Supervision of pregnancy with insufficient antenatal care, unspecified trimester: Secondary | ICD-10-CM

## 2018-07-24 DIAGNOSIS — Z3480 Encounter for supervision of other normal pregnancy, unspecified trimester: Secondary | ICD-10-CM

## 2018-07-24 MED ORDER — BLOOD PRESSURE MONITORING KIT: 1.0000 | PACK | 0 refills | Status: AC

## 2018-07-24 NOTE — Progress Notes (Signed)
ROB keeping log of  Blood sugars  noted sugar was 148 yesterday  Pt notes burning w/ urination and odor. Onset 2 days ago.

## 2018-07-24 NOTE — Progress Notes (Signed)
  PRENATAL VISIT NOTE This visit was done via Webex Subjective:  Cindy Watts is a 28 y.o. [phone] at [redacted]w[redacted]d being seen today for ongoing prenatal care.  She is currently monitored for the following issues for this high-risk pregnancy and has Supervision of other normal pregnancy, antepartum; Late prenatal care, antepartum; Iron deficiency anemia of mother during pregnancy; Previous cesarean delivery affecting pregnancy, antepartum; Maternal atypical antibody complicating pregnancy, unspecified trimester, other fetus; Opioid dependence ([place]); and Gestational diabetes on their problem list.  Patient reports no complaints.  Contractions: Not present. Vag. Bleeding: None.  Movement: Present. Denies leaking of fluid.   The following portions of the patient's history were reviewed and updated as appropriate: allergies, current medications, past family history, past medical history, past social history, past surgical history and problem list.   Objective:  There were no vitals filed for this visit.  Fetal Status:     Movement: Present     General:  Alert, oriented and cooperative. Patient is in no acute distress.  Skin: Skin is warm and dry. No rash noted.   Cardiovascular: Normal heart rate noted  Respiratory: Normal respiratory effort, no problems with respiration noted  Abdomen: Soft, gravid, appropriate for gestational age.  Pain/Pressure: Absent     Pelvic: Cervical exam deferred        Extremities: Normal range of motion.  Edema: None  Mental Status: Normal mood and affect. Normal behavior. Normal judgment and thought content.   Assessment and Plan:  Pregnancy: G4P3003 at [redacted]w[redacted]d 1. Supervision of other normal pregnancy, antepartum - She does not have a BP cuff at home so I will write a presecription for this  2. Previous cesarean delivery affecting pregnancy, antepartum - x 3 in the past - will need a RLTCS, declines BTL  3. Late prenatal care, antepartum  4. Gestational  diabetes mellitus (GDM) in third trimester, gestational diabetes method of control unspecified -She has only had 3 elevated values in the past week when she started testing her sugars -She has an appt with nutritionist on 08-08-18 - scheduled for dopplers with MFM weekly   5. On Methadone maintainence  6. Odor- she is not sure if this is a UTI or yeast infection- She needs to come by for a self swab and urine culture  Preterm labor symptoms and general obstetric precautions including but not limited to vaginal bleeding, contractions, leaking of fluid and fetal movement were reviewed in detail with the patient. Please refer to After Visit Summary for other counseling recommendations.   No follow-ups on file.  Future Appointments  Date Time Provider [place]  07/24/2018  2:15 PM Emily Filbert, MD [facility] None  07/26/2018  3:00 PM [place] [facility]-US  07/26/2018  3:00 PM [place] 3 WH-MFCUS MFC-US  08/08/2018  2:00 PM [facility]EDUCATION [facility] WOC    Lindsey Demonte Marijo Sanes, MD

## 2018-07-24 NOTE — Addendum Note (Signed)
Addended by: Tristan Schroeder D on: 07/24/2018 02:09 PM   Modules accepted: Orders

## 2018-07-26 ENCOUNTER — Other Ambulatory Visit (HOSPITAL_COMMUNITY): Payer: Self-pay | Admitting: Obstetrics and Gynecology

## 2018-07-26 ENCOUNTER — Ambulatory Visit: Payer: Medicare PPO

## 2018-07-26 ENCOUNTER — Encounter (HOSPITAL_COMMUNITY): Payer: Self-pay

## 2018-07-26 ENCOUNTER — Other Ambulatory Visit: Payer: Self-pay

## 2018-07-26 ENCOUNTER — Ambulatory Visit (HOSPITAL_COMMUNITY)
Admission: RE | Admit: 2018-07-26 | Discharge: 2018-07-26 | Disposition: A | Discharge status: Home / Self Care | Payer: Medicare PPO | Source: Ambulatory Visit | Attending: Obstetrics and Gynecology | Admitting: Obstetrics and Gynecology

## 2018-07-26 ENCOUNTER — Ambulatory Visit (HOSPITAL_COMMUNITY): Payer: Medicare PPO | Admitting: *Deleted

## 2018-07-26 DIAGNOSIS — R760 Raised antibody titer: Secondary | ICD-10-CM

## 2018-07-26 DIAGNOSIS — O0933 Supervision of pregnancy with insufficient antenatal care, third trimester: Secondary | ICD-10-CM

## 2018-07-26 DIAGNOSIS — Z362 Encounter for other antenatal screening follow-up: Secondary | ICD-10-CM | POA: Diagnosis not present

## 2018-07-26 DIAGNOSIS — O99333 Smoking (tobacco) complicating pregnancy, third trimester: Secondary | ICD-10-CM

## 2018-07-26 DIAGNOSIS — O99019 Anemia complicating pregnancy, unspecified trimester: Secondary | ICD-10-CM | POA: Diagnosis present

## 2018-07-26 DIAGNOSIS — O361999 Maternal care for other isoimmunization, unspecified trimester, other fetus: Secondary | ICD-10-CM | POA: Diagnosis present

## 2018-07-26 DIAGNOSIS — O365920 Maternal care for other known or suspected poor fetal growth, second trimester, not applicable or unspecified: Secondary | ICD-10-CM

## 2018-07-26 DIAGNOSIS — O360930 Maternal care for other rhesus isoimmunization, third trimester, not applicable or unspecified: Secondary | ICD-10-CM

## 2018-07-26 DIAGNOSIS — D509 Iron deficiency anemia, unspecified: Secondary | ICD-10-CM | POA: Insufficient documentation

## 2018-07-26 DIAGNOSIS — O34219 Maternal care for unspecified type scar from previous cesarean delivery: Secondary | ICD-10-CM

## 2018-07-26 DIAGNOSIS — O99013 Anemia complicating pregnancy, third trimester: Secondary | ICD-10-CM | POA: Diagnosis not present

## 2018-07-26 DIAGNOSIS — Z3A32 32 weeks gestation of pregnancy: Secondary | ICD-10-CM

## 2018-07-26 DIAGNOSIS — O365930 Maternal care for other known or suspected poor fetal growth, third trimester, not applicable or unspecified: Secondary | ICD-10-CM

## 2018-07-27 ENCOUNTER — Other Ambulatory Visit (HOSPITAL_COMMUNITY): Payer: Self-pay | Admitting: *Deleted

## 2018-07-27 DIAGNOSIS — O365930 Maternal care for other known or suspected poor fetal growth, third trimester, not applicable or unspecified: Secondary | ICD-10-CM

## 2018-07-27 DIAGNOSIS — O361130 Maternal care for Anti-A sensitization, third trimester, not applicable or unspecified: Secondary | ICD-10-CM

## 2018-08-02 ENCOUNTER — Telehealth: Payer: Self-pay | Admitting: Obstetrics

## 2018-08-02 ENCOUNTER — Ambulatory Visit (HOSPITAL_COMMUNITY)
Admission: RE | Admit: 2018-08-02 | Discharge: 2018-08-02 | Disposition: A | Discharge status: Home / Self Care | Payer: Medicare PPO | Source: Ambulatory Visit | Attending: Obstetrics and Gynecology | Admitting: Obstetrics and Gynecology

## 2018-08-02 ENCOUNTER — Ambulatory Visit (HOSPITAL_COMMUNITY): Payer: Medicare PPO

## 2018-08-02 ENCOUNTER — Encounter (HOSPITAL_COMMUNITY): Payer: Self-pay

## 2018-08-07 ENCOUNTER — Encounter: Payer: Medicare PPO | Admitting: Obstetrics

## 2018-08-08 ENCOUNTER — Telehealth: Payer: Self-pay | Admitting: Obstetrics

## 2018-08-08 ENCOUNTER — Other Ambulatory Visit: Payer: Medicare PPO

## 2018-08-09 ENCOUNTER — Ambulatory Visit (HOSPITAL_COMMUNITY): Payer: Medicare PPO

## 2018-08-09 ENCOUNTER — Ambulatory Visit (HOSPITAL_COMMUNITY)
Admission: RE | Admit: 2018-08-09 | Discharge: 2018-08-09 | Disposition: A | Discharge status: Home / Self Care | Payer: Medicare PPO | Source: Ambulatory Visit | Attending: Obstetrics and Gynecology | Admitting: Obstetrics and Gynecology

## 2018-08-13 NOTE — Progress Notes (Signed)
   PRENATAL VISIT NOTE  Subjective:  Cindy Watts is a 28 y.o. [phone] at [redacted]w[redacted]d being seen today for ongoing prenatal care.  She is currently monitored for the following issues for this high-risk pregnancy and has Supervision of other normal pregnancy, antepartum; Late prenatal care, antepartum; Iron deficiency anemia of mother during pregnancy; Previous cesarean delivery affecting pregnancy, antepartum; Maternal atypical antibody complicating pregnancy, unspecified trimester, other fetus; Opioid dependence ([place]); and Gestational diabetes on their problem list.  Patient reports no complaints.  Contractions: Not present. Vag. Bleeding: None.  Movement: Present. Denies leaking of fluid.   The following portions of the patient's history were reviewed and updated as appropriate: allergies, current medications, past family history, past medical history, past social history, past surgical history and problem list.   Objective:   Vitals:   07/10/18 1010  BP: 111/76  Pulse: 92  Weight: 121 lb (54.9 kg)    Fetal Status: Fetal Heart Rate (bpm): 130   Movement: Present     General:  Alert, oriented and cooperative. Patient is in no acute distress.  Skin: Skin is warm and dry. No rash noted.   Cardiovascular: Normal heart rate noted  Respiratory: Normal respiratory effort, no problems with respiration noted  Abdomen: Soft, gravid, appropriate for gestational age.  Pain/Pressure: Absent     Pelvic: Cervical exam deferred        Extremities: Normal range of motion.     Mental Status: Normal mood and affect. Normal behavior. Normal judgment and thought content.   Assessment and Plan:  Pregnancy: G4P3003 at [redacted]w[redacted]d 1. Supervision of other normal pregnancy, antepartum Routine labs - Glucose Tolerance, 2 Hours w/1 Hour - CBC - HIV antibody (with reflex) - RPR - Tdap vaccine greater than or equal to 7yo IM Methadone 60 mg / day 2. Gestational diabetes mellitus (GDM) in third trimester,  gestational diabetes method of control unspecified   Preterm labor symptoms and general obstetric precautions including but not limited to vaginal bleeding, contractions, leaking of fluid and fetal movement were reviewed in detail with the patient. Please refer to After Visit Summary for other counseling recommendations.   Return in about 2 weeks (around 07/24/2018).  Future Appointments  Date Time Provider [place]  08/16/2018  1:30 PM [place] [facility]  08/16/2018  1:30 PM WH-MFC [place] 1 WH-MFCUS MFC-US  08/16/2018  2:15 PM [place] [facility] [place] MFC-US    Emeterio Reeve, MD

## 2018-08-16 ENCOUNTER — Ambulatory Visit (HOSPITAL_COMMUNITY): Payer: Medicare PPO

## 2018-08-21 ENCOUNTER — Ambulatory Visit (INDEPENDENT_AMBULATORY_CARE_PROVIDER_SITE_OTHER): Payer: Medicare PPO | Admitting: Obstetrics and Gynecology

## 2018-08-21 ENCOUNTER — Other Ambulatory Visit: Payer: Self-pay

## 2018-08-21 ENCOUNTER — Encounter: Payer: Self-pay | Admitting: Obstetrics and Gynecology

## 2018-08-21 ENCOUNTER — Other Ambulatory Visit (HOSPITAL_COMMUNITY)
Admission: RE | Admit: 2018-08-21 | Discharge: 2018-08-21 | Disposition: A | Discharge status: Home / Self Care | Payer: Medicare PPO | Source: Ambulatory Visit | Attending: Obstetrics and Gynecology | Admitting: Obstetrics and Gynecology

## 2018-08-21 VITALS — BP 102/68 | HR 73

## 2018-08-21 DIAGNOSIS — Z3480 Encounter for supervision of other normal pregnancy, unspecified trimester: Secondary | ICD-10-CM

## 2018-08-21 DIAGNOSIS — Z3A36 36 weeks gestation of pregnancy: Secondary | ICD-10-CM

## 2018-08-21 DIAGNOSIS — O24419 Gestational diabetes mellitus in pregnancy, unspecified control: Secondary | ICD-10-CM

## 2018-08-21 DIAGNOSIS — F1129 Opioid dependence with unspecified opioid-induced disorder: Secondary | ICD-10-CM

## 2018-08-21 DIAGNOSIS — O0993 Supervision of high risk pregnancy, unspecified, third trimester: Secondary | ICD-10-CM

## 2018-08-21 DIAGNOSIS — O361999 Maternal care for other isoimmunization, unspecified trimester, other fetus: Secondary | ICD-10-CM

## 2018-08-21 DIAGNOSIS — O34219 Maternal care for unspecified type scar from previous cesarean delivery: Secondary | ICD-10-CM

## 2018-08-21 NOTE — Progress Notes (Signed)
Pt is here for ROB, [redacted]w[redacted]d. Pt reports she has been checking her BG levels everyday and they have been within normal range.

## 2018-08-21 NOTE — Progress Notes (Signed)
   PRENATAL VISIT NOTE  Subjective:  Cindy Watts is a 28 y.o. [phone] at [redacted]w[redacted]d being seen today for ongoing prenatal care.  She is currently monitored for the following issues for this high-risk pregnancy and has Supervision of other normal pregnancy, antepartum; Late prenatal care, antepartum; Iron deficiency anemia of mother during pregnancy; Previous cesarean delivery affecting pregnancy, antepartum; Maternal atypical antibody complicating pregnancy, unspecified trimester, other fetus; Opioid dependence ([place]); and Gestational diabetes on their problem list.  Patient reports no complaints.  Contractions: Not present. Vag. Bleeding: None.  Movement: Present. Denies leaking of fluid.   The following portions of the patient's history were reviewed and updated as appropriate: allergies, current medications, past family history, past medical history, past social history, past surgical history and problem list.   Objective:   Vitals:   08/21/18 0932  BP: 102/68  Pulse: 73    Fetal Status: Fetal Heart Rate (bpm): 135 Fundal Height: 32 cm Movement: Present  Presentation: Vertex  General:  Alert, oriented and cooperative. Patient is in no acute distress.  Skin: Skin is warm and dry. No rash noted.   Cardiovascular: Normal heart rate noted  Respiratory: Normal respiratory effort, no problems with respiration noted  Abdomen: Soft, gravid, appropriate for gestational age.  Pain/Pressure: Absent     Pelvic: Cervical exam performed Dilation: Closed Effacement (%): Thick Station: Ballotable  Extremities: Normal range of motion.  Edema: None  Mental Status: Normal mood and affect. Normal behavior. Normal judgment and thought content.   Assessment and Plan:  Pregnancy: G4P3003 at [redacted]w[redacted]d 1. Supervision of other normal pregnancy, antepartum Patient is doing well without complaints Undecided on pediatrician Cultures today  2. Opioid dependence with opioid-induced disorder (H[facility]) Stable on  methadone 65 mg daily  3. Maternal atypical antibody complicating pregnancy, unspecified trimester, other fetus Follow up ultrasound on 8/13  4. Gestational diabetes - Patient did not bring log book but reports fasting no higher than 92 and pp as high as 120  5. Previous cesarean delivery affecting pregnancy, antepartum Message sent to schedule cesarean section at 39 weeks on 8/31  Preterm labor symptoms and general obstetric precautions including but not limited to vaginal bleeding, contractions, leaking of fluid and fetal movement were reviewed in detail with the patient. Please refer to After Visit Summary for other counseling recommendations.   No follow-ups on file.  Future Appointments  Date Time Provider [place]  08/23/2018  2:45 PM [place] 2 WH-MFCUS MFC-US  08/23/2018  2:50 PM [place] MFC-US  08/23/2018  3:15 PM WH-MFC NST [place] MFC-US    Mora Bellman, MD

## 2018-08-23 ENCOUNTER — Ambulatory Visit (HOSPITAL_COMMUNITY)
Admission: RE | Admit: 2018-08-23 | Discharge: 2018-08-23 | Disposition: A | Discharge status: Home / Self Care | Payer: Medicare PPO | Source: Ambulatory Visit | Attending: Obstetrics and Gynecology | Admitting: Obstetrics and Gynecology

## 2018-08-23 ENCOUNTER — Ambulatory Visit (HOSPITAL_COMMUNITY): Payer: Medicare PPO

## 2018-08-23 LAB — STREP GP B NAA: Strep Gp B NAA: NEGATIVE

## 2018-08-23 LAB — CERVICOVAGINAL ANCILLARY ONLY
Chlamydia: NEGATIVE
Neisseria Gonorrhea: NEGATIVE

## 2018-08-27 ENCOUNTER — Telehealth (HOSPITAL_COMMUNITY): Payer: Self-pay | Admitting: *Deleted

## 2018-08-27 NOTE — Telephone Encounter (Signed)
Preadmission screen  

## 2018-08-27 NOTE — Patient Instructions (Signed)
Cindy Watts  08/27/2018   Your procedure is scheduled on:  09/10/2018  Arrive at [place] at Entrance C on [address] at [facility]  and [address]. You are invited to use the FREE valet parking or use the Visitor's parking deck.  Pick up the phone at the desk and dial [phone].  Call this number if you have problems the morning of surgery: [phone]  Remember:   Do not eat food:(After Midnight) Desps de medianoche.  Do not drink clear liquids: (After Midnight) Desps de medianoche.  Take these medicines the morning of surgery with A SIP OF WATER:  Take methadone as prescribed by physician   Do not wear jewelry, make-up or nail polish.  Do not wear lotions, powders, or perfumes. Do not wear deodorant.  Do not shave 48 hours prior to surgery.  Do not bring valuables to the hospital.  [facility] is not   responsible for any belongings or valuables brought to the hospital.  Contacts, dentures or bridgework may not be worn into surgery.  Leave suitcase in the car. After surgery it may be brought to your room.  For patients admitted to the hospital, checkout time is 11:00 AM the day of              discharge.      Please read over the following fact sheets that you were given:     Preparing for Surgery

## 2018-08-28 ENCOUNTER — Telehealth (INDEPENDENT_AMBULATORY_CARE_PROVIDER_SITE_OTHER): Payer: Medicare PPO | Admitting: Obstetrics and Gynecology

## 2018-08-28 ENCOUNTER — Encounter: Payer: Self-pay | Admitting: Obstetrics and Gynecology

## 2018-08-28 DIAGNOSIS — D509 Iron deficiency anemia, unspecified: Secondary | ICD-10-CM

## 2018-08-28 DIAGNOSIS — Z3A37 37 weeks gestation of pregnancy: Secondary | ICD-10-CM

## 2018-08-28 DIAGNOSIS — O99013 Anemia complicating pregnancy, third trimester: Secondary | ICD-10-CM

## 2018-08-28 DIAGNOSIS — O99019 Anemia complicating pregnancy, unspecified trimester: Secondary | ICD-10-CM

## 2018-08-28 DIAGNOSIS — O34219 Maternal care for unspecified type scar from previous cesarean delivery: Secondary | ICD-10-CM

## 2018-08-28 DIAGNOSIS — O361999 Maternal care for other isoimmunization, unspecified trimester, other fetus: Secondary | ICD-10-CM

## 2018-08-28 DIAGNOSIS — O99324 Drug use complicating childbirth: Secondary | ICD-10-CM

## 2018-08-28 DIAGNOSIS — Z3480 Encounter for supervision of other normal pregnancy, unspecified trimester: Secondary | ICD-10-CM

## 2018-08-28 DIAGNOSIS — F1129 Opioid dependence with unspecified opioid-induced disorder: Secondary | ICD-10-CM

## 2018-08-28 DIAGNOSIS — O24419 Gestational diabetes mellitus in pregnancy, unspecified control: Secondary | ICD-10-CM

## 2018-08-28 DIAGNOSIS — O361993 Maternal care for other isoimmunization, unspecified trimester, fetus 3: Secondary | ICD-10-CM

## 2018-08-28 NOTE — Progress Notes (Signed)
Virtual Visit via Telephone Note  I connected with Cindy Watts on 08/28/18 at  9:30 AM EDT by telephone and verified that I am speaking with the correct person using two identifiers.   GDM pt: Fasting 96 p.c.125 Pt not home and unable to check BP Pt instructed to download Babyscripts to enter CBG and BP readings

## 2018-08-28 NOTE — Progress Notes (Signed)
   [place] VIRTUAL VIDEO VISIT ENCOUNTER NOTE  Provider location: [facility] [place]   I connected with [name] on 08/28/18 at  9:30 AM EDT by MyChart Video Encounter at home and verified that I am speaking with the correct person using two identifiers.   I discussed the limitations, risks, security and privacy concerns of performing an evaluation and management service virtually and the availability of in person appointments. I also discussed with the patient that there may be a patient responsible charge related to this service. The patient expressed understanding and agreed to proceed. Subjective:  Cindy Watts is a 28 y.o. [phone] at [redacted]w[redacted]d being seen today for ongoing prenatal care.  She is currently monitored for the following issues for this high-risk pregnancy and has Supervision of other normal pregnancy, antepartum; Late prenatal care, antepartum; Iron deficiency anemia of mother during pregnancy; Previous cesarean delivery affecting pregnancy, antepartum; Maternal atypical antibody complicating pregnancy, unspecified trimester, other fetus; Opioid dependence ([place]); and Gestational diabetes on their problem list.  Patient reports no complaints.  Contractions: Not present. Vag. Bleeding: None.  Movement: Present. Denies any leaking of fluid.   The following portions of the patient's history were reviewed and updated as appropriate: allergies, current medications, past family history, past medical history, past social history, past surgical history and problem list.   Objective:  There were no vitals filed for this visit.  Fetal Status:     Movement: Present     General:  Alert, oriented and cooperative. Patient is in no acute distress.  Respiratory: Normal respiratory effort, no problems with respiration noted  Mental Status: Normal mood and affect. Normal behavior. Normal judgment and thought content.  Rest of physical  exam deferred due to type of encounter  Imaging: No results found.  Assessment and Plan:  Pregnancy: G4P3003 at [redacted]w[redacted]d 1. Supervision of other normal pregnancy, antepartum Patient is doing well without complaints  2. Gestational diabetes mellitus (GDM) in third trimester, gestational diabetes method of control unspecified Patient was not home but reports fasting in the low 90's and pp no higher than 115 Continue diet control  3. Opioid dependence with opioid-induced disorder ([place]) Continue methadone  4. Maternal atypical antibody complicating pregnancy, unspecified trimester, other fetus Follow up MFM scan on 08/30/18  5. Previous cesarean delivery affecting pregnancy, antepartum Patient scheduled for repeat on 09/10/18  6. Iron deficiency anemia of mother during pregnancy   Term labor symptoms and general obstetric precautions including but not limited to vaginal bleeding, contractions, leaking of fluid and fetal movement were reviewed in detail with the patient. I discussed the assessment and treatment plan with the patient. The patient was provided an opportunity to ask questions and all were answered. The patient agreed with the plan and demonstrated an understanding of the instructions. The patient was advised to call back or seek an in-person office evaluation/go to [facility] at [facility] for any urgent or concerning symptoms. Please refer to After Visit Summary for other counseling recommendations.   I provided 15 minutes of face-to-face time during this encounter.  Return in about 1 week (around 09/04/2018) for in person, ROB.  Future Appointments  Date Time Provider [place]  08/30/2018  2:45 PM [place] [facility] MFC-US  08/30/2018  2:45 PM [place] 2 WH-MFCUS MFC-US  09/08/2018  8:50 AM MC-MAU 1 MC-INDC None    Mora Bellman, MD Center for [employer], [place]

## 2018-08-29 ENCOUNTER — Telehealth (HOSPITAL_COMMUNITY): Payer: Self-pay | Admitting: *Deleted

## 2018-08-29 NOTE — Telephone Encounter (Signed)
Preadmission screen  

## 2018-08-30 ENCOUNTER — Encounter (HOSPITAL_COMMUNITY): Payer: Self-pay

## 2018-08-30 ENCOUNTER — Ambulatory Visit (HOSPITAL_COMMUNITY): Payer: Medicare PPO | Admitting: *Deleted

## 2018-08-30 ENCOUNTER — Other Ambulatory Visit (HOSPITAL_COMMUNITY): Payer: Self-pay | Admitting: Obstetrics and Gynecology

## 2018-08-30 ENCOUNTER — Other Ambulatory Visit (HOSPITAL_COMMUNITY): Payer: Self-pay | Admitting: *Deleted

## 2018-08-30 ENCOUNTER — Ambulatory Visit (HOSPITAL_BASED_OUTPATIENT_CLINIC_OR_DEPARTMENT_OTHER)
Admission: RE | Admit: 2018-08-30 | Discharge: 2018-08-30 | Disposition: A | Discharge status: Home / Self Care | Payer: Medicare PPO | Source: Ambulatory Visit | Attending: Obstetrics and Gynecology | Admitting: Obstetrics and Gynecology

## 2018-08-30 ENCOUNTER — Other Ambulatory Visit: Payer: Self-pay

## 2018-08-30 DIAGNOSIS — O361130 Maternal care for Anti-A sensitization, third trimester, not applicable or unspecified: Secondary | ICD-10-CM

## 2018-08-30 DIAGNOSIS — O0933 Supervision of pregnancy with insufficient antenatal care, third trimester: Secondary | ICD-10-CM

## 2018-08-30 DIAGNOSIS — O34219 Maternal care for unspecified type scar from previous cesarean delivery: Secondary | ICD-10-CM | POA: Insufficient documentation

## 2018-08-30 DIAGNOSIS — O365990 Maternal care for other known or suspected poor fetal growth, unspecified trimester, not applicable or unspecified: Secondary | ICD-10-CM

## 2018-08-30 DIAGNOSIS — O361920 Maternal care for other isoimmunization, second trimester, not applicable or unspecified: Secondary | ICD-10-CM | POA: Insufficient documentation

## 2018-08-30 DIAGNOSIS — O365930 Maternal care for other known or suspected poor fetal growth, third trimester, not applicable or unspecified: Secondary | ICD-10-CM

## 2018-08-30 DIAGNOSIS — O361999 Maternal care for other isoimmunization, unspecified trimester, other fetus: Secondary | ICD-10-CM

## 2018-08-30 DIAGNOSIS — O360930 Maternal care for other rhesus isoimmunization, third trimester, not applicable or unspecified: Secondary | ICD-10-CM | POA: Diagnosis not present

## 2018-08-30 DIAGNOSIS — O99019 Anemia complicating pregnancy, unspecified trimester: Secondary | ICD-10-CM | POA: Insufficient documentation

## 2018-08-30 DIAGNOSIS — Z3A37 37 weeks gestation of pregnancy: Secondary | ICD-10-CM

## 2018-08-30 DIAGNOSIS — O99013 Anemia complicating pregnancy, third trimester: Secondary | ICD-10-CM | POA: Diagnosis not present

## 2018-08-30 DIAGNOSIS — O99333 Smoking (tobacco) complicating pregnancy, third trimester: Secondary | ICD-10-CM

## 2018-08-30 DIAGNOSIS — D509 Iron deficiency anemia, unspecified: Secondary | ICD-10-CM

## 2018-08-30 DIAGNOSIS — Z362 Encounter for other antenatal screening follow-up: Secondary | ICD-10-CM

## 2018-08-31 ENCOUNTER — Other Ambulatory Visit: Payer: Self-pay | Admitting: Obstetrics and Gynecology

## 2018-09-01 ENCOUNTER — Inpatient Hospital Stay (HOSPITAL_COMMUNITY): Payer: Medicare PPO | Admitting: Anesthesiology

## 2018-09-01 ENCOUNTER — Encounter (HOSPITAL_COMMUNITY)
Admission: AD | Disposition: A | Discharge status: Home / Self Care | Payer: Self-pay | Source: Home / Self Care | Attending: Obstetrics and Gynecology

## 2018-09-01 ENCOUNTER — Inpatient Hospital Stay (HOSPITAL_COMMUNITY)
Admission: AD | Admit: 2018-09-01 | Discharge: 2018-09-04 | DRG: 787 | Disposition: A | Discharge status: Home / Self Care | Payer: Medicare PPO | Attending: Obstetrics and Gynecology | Admitting: Obstetrics and Gynecology

## 2018-09-01 ENCOUNTER — Other Ambulatory Visit: Payer: Self-pay

## 2018-09-01 ENCOUNTER — Encounter (HOSPITAL_COMMUNITY): Payer: Self-pay | Admitting: *Deleted

## 2018-09-01 DIAGNOSIS — O365930 Maternal care for other known or suspected poor fetal growth, third trimester, not applicable or unspecified: Secondary | ICD-10-CM | POA: Diagnosis present

## 2018-09-01 DIAGNOSIS — O99334 Smoking (tobacco) complicating childbirth: Secondary | ICD-10-CM | POA: Diagnosis present

## 2018-09-01 DIAGNOSIS — O24420 Gestational diabetes mellitus in childbirth, diet controlled: Secondary | ICD-10-CM

## 2018-09-01 DIAGNOSIS — Z3A37 37 weeks gestation of pregnancy: Secondary | ICD-10-CM

## 2018-09-01 DIAGNOSIS — O99824 Streptococcus B carrier state complicating childbirth: Secondary | ICD-10-CM | POA: Diagnosis present

## 2018-09-01 DIAGNOSIS — O34219 Maternal care for unspecified type scar from previous cesarean delivery: Secondary | ICD-10-CM | POA: Diagnosis present

## 2018-09-01 DIAGNOSIS — O99324 Drug use complicating childbirth: Secondary | ICD-10-CM

## 2018-09-01 DIAGNOSIS — O24410 Gestational diabetes mellitus in pregnancy, diet controlled: Secondary | ICD-10-CM | POA: Diagnosis present

## 2018-09-01 DIAGNOSIS — O4290 Premature rupture of membranes, unspecified as to length of time between rupture and onset of labor, unspecified weeks of gestation: Secondary | ICD-10-CM

## 2018-09-01 DIAGNOSIS — O4292 Full-term premature rupture of membranes, unspecified as to length of time between rupture and onset of labor: Secondary | ICD-10-CM | POA: Diagnosis present

## 2018-09-01 DIAGNOSIS — Z3689 Encounter for other specified antenatal screening: Secondary | ICD-10-CM

## 2018-09-01 DIAGNOSIS — Z20828 Contact with and (suspected) exposure to other viral communicable diseases: Secondary | ICD-10-CM | POA: Diagnosis present

## 2018-09-01 DIAGNOSIS — D509 Iron deficiency anemia, unspecified: Secondary | ICD-10-CM | POA: Diagnosis present

## 2018-09-01 DIAGNOSIS — O99013 Anemia complicating pregnancy, third trimester: Secondary | ICD-10-CM | POA: Diagnosis present

## 2018-09-01 DIAGNOSIS — Z23 Encounter for immunization: Secondary | ICD-10-CM

## 2018-09-01 DIAGNOSIS — F1120 Opioid dependence, uncomplicated: Secondary | ICD-10-CM

## 2018-09-01 DIAGNOSIS — O4202 Full-term premature rupture of membranes, onset of labor within 24 hours of rupture: Secondary | ICD-10-CM

## 2018-09-01 DIAGNOSIS — O34211 Maternal care for low transverse scar from previous cesarean delivery: Principal | ICD-10-CM | POA: Diagnosis present

## 2018-09-01 DIAGNOSIS — F17210 Nicotine dependence, cigarettes, uncomplicated: Secondary | ICD-10-CM | POA: Diagnosis present

## 2018-09-01 DIAGNOSIS — Z30017 Encounter for initial prescription of implantable subdermal contraceptive: Secondary | ICD-10-CM

## 2018-09-01 DIAGNOSIS — O24419 Gestational diabetes mellitus in pregnancy, unspecified control: Secondary | ICD-10-CM | POA: Diagnosis present

## 2018-09-01 HISTORY — DX: Gestational diabetes mellitus in pregnancy, unspecified control: O24.419

## 2018-09-01 LAB — URINALYSIS, ROUTINE W REFLEX MICROSCOPIC
Bacteria, UA: NONE SEEN
Bilirubin Urine: NEGATIVE
Glucose, UA: NEGATIVE mg/dL
Hgb urine dipstick: NEGATIVE
Ketones, ur: NEGATIVE mg/dL
Nitrite: NEGATIVE
Protein, ur: NEGATIVE mg/dL
Specific Gravity, Urine: 1.014 (ref 1.005–1.030)
pH: 7 (ref 5.0–8.0)

## 2018-09-01 LAB — CBC
HCT: 36.5 % (ref 36.0–46.0)
Hemoglobin: 11.9 g/dL — ABNORMAL LOW (ref 12.0–15.0)
MCH: 25.9 pg — ABNORMAL LOW (ref 26.0–34.0)
MCHC: 32.6 g/dL (ref 30.0–36.0)
MCV: 79.5 fL — ABNORMAL LOW (ref 80.0–100.0)
Platelets: 286 10*3/uL (ref 150–400)
RBC: 4.59 MIL/uL (ref 3.87–5.11)
RDW: 15.7 % — ABNORMAL HIGH (ref 11.5–15.5)
WBC: 10.3 10*3/uL (ref 4.0–10.5)
nRBC: 0 % (ref 0.0–0.2)

## 2018-09-01 LAB — PREPARE RBC (CROSSMATCH)

## 2018-09-01 LAB — POCT FERN TEST: POCT Fern Test: POSITIVE

## 2018-09-01 LAB — ABO/RH: ABO/RH(D): B POS

## 2018-09-01 LAB — SARS CORONAVIRUS 2 (TAT 6-24 HRS): SARS Coronavirus 2: NEGATIVE

## 2018-09-01 SURGERY — Surgical Case
Anesthesia: Regional

## 2018-09-01 SURGERY — Surgical Case
Anesthesia: Spinal

## 2018-09-01 MED ORDER — PHENYLEPHRINE HCL-NACL 20-0.9 MG/250ML-% IV SOLN
INTRAVENOUS | Status: DC | PRN
  Administered 2018-09-01: 60 ug/min via INTRAVENOUS

## 2018-09-01 MED ORDER — SODIUM CHLORIDE 0.9 % IR SOLN
Status: DC | PRN
  Administered 2018-09-01: 1000 mL

## 2018-09-01 MED ORDER — KETOROLAC TROMETHAMINE 30 MG/ML IJ SOLN: 30.0000 mg | Freq: Once | INTRAMUSCULAR | Status: DC | PRN

## 2018-09-01 MED ORDER — FENTANYL CITRATE (PF) 100 MCG/2ML IJ SOLN
INTRAMUSCULAR | Status: DC | PRN
  Administered 2018-09-01: 15 ug via INTRATHECAL

## 2018-09-01 MED ORDER — SOD CITRATE-CITRIC ACID 500-334 MG/5ML PO SOLN
30.0000 mL | ORAL | Status: AC
  Administered 2018-09-01: 30 mL via ORAL
  Filled 2018-09-01: qty 30

## 2018-09-01 MED ORDER — CEFAZOLIN SODIUM-DEXTROSE 2-4 GM/100ML-% IV SOLN
2.0000 g | INTRAVENOUS | Status: AC
  Administered 2018-09-01: 21:00:00 2 g via INTRAVENOUS
  Filled 2018-09-01: qty 100

## 2018-09-01 MED ORDER — BUPIVACAINE HCL (PF) 0.5 % IJ SOLN
INTRAMUSCULAR | Status: DC | PRN
  Administered 2018-09-01: 30 mL

## 2018-09-01 MED ORDER — LACTATED RINGERS IV BOLUS
1000.0000 mL | Freq: Once | INTRAVENOUS | Status: AC
  Administered 2018-09-01: 1000 mL via INTRAVENOUS

## 2018-09-01 MED ORDER — BUPIVACAINE HCL (PF) 0.5 % IJ SOLN
INTRAMUSCULAR | Status: AC
  Filled 2018-09-01: qty 30

## 2018-09-01 MED ORDER — MORPHINE SULFATE (PF) 0.5 MG/ML IJ SOLN
INTRAMUSCULAR | Status: AC
  Filled 2018-09-01: qty 10

## 2018-09-01 MED ORDER — HYDROMORPHONE HCL 1 MG/ML IJ SOLN
0.2500 mg | INTRAMUSCULAR | Status: DC | PRN
  Administered 2018-09-01 (×2): 0.5 mg via INTRAVENOUS

## 2018-09-01 MED ORDER — PROMETHAZINE HCL 25 MG/ML IJ SOLN: 6.2500 mg | INTRAMUSCULAR | Status: DC | PRN

## 2018-09-01 MED ORDER — DEXAMETHASONE SODIUM PHOSPHATE 10 MG/ML IJ SOLN
INTRAMUSCULAR | Status: DC | PRN
  Administered 2018-09-01: 10 mg via INTRAVENOUS

## 2018-09-01 MED ORDER — SODIUM CHLORIDE 0.9 % IR SOLN
Status: DC | PRN
  Administered 2018-09-01: 200 mL

## 2018-09-01 MED ORDER — SODIUM CHLORIDE 0.9 % IV SOLN
INTRAVENOUS | Status: DC | PRN
  Administered 2018-09-01: 40 [IU] via INTRAVENOUS

## 2018-09-01 MED ORDER — OXYTOCIN 40 UNITS IN NORMAL SALINE INFUSION - SIMPLE MED
INTRAVENOUS | Status: AC
  Filled 2018-09-01: qty 1000

## 2018-09-01 MED ORDER — KETOROLAC TROMETHAMINE 15 MG/ML IJ SOLN
15.0000 mg | INTRAMUSCULAR | Status: AC
  Administered 2018-09-01: 15 mg via INTRAVENOUS
  Filled 2018-09-01: qty 1

## 2018-09-01 MED ORDER — MEPERIDINE HCL 25 MG/ML IJ SOLN: 6.2500 mg | INTRAMUSCULAR | Status: DC | PRN

## 2018-09-01 MED ORDER — GABAPENTIN 300 MG PO CAPS
300.0000 mg | ORAL_CAPSULE | ORAL | Status: AC
  Administered 2018-09-01: 300 mg via ORAL
  Filled 2018-09-01: qty 1

## 2018-09-01 MED ORDER — BUPIVACAINE IN DEXTROSE 0.75-8.25 % IT SOLN
INTRATHECAL | Status: DC | PRN
  Administered 2018-09-01: 1.4 mL via INTRATHECAL

## 2018-09-01 MED ORDER — SODIUM CHLORIDE 0.9 % IV SOLN
INTRAVENOUS | Status: DC | PRN
  Administered 2018-09-01: 21:00:00 via INTRAVENOUS

## 2018-09-01 MED ORDER — MORPHINE SULFATE (PF) 0.5 MG/ML IJ SOLN
INTRAMUSCULAR | Status: DC | PRN
  Administered 2018-09-01: .15 mg via INTRATHECAL

## 2018-09-01 MED ORDER — DEXAMETHASONE SODIUM PHOSPHATE 10 MG/ML IJ SOLN
INTRAMUSCULAR | Status: AC
  Filled 2018-09-01: qty 1

## 2018-09-01 MED ORDER — HYDROMORPHONE HCL 1 MG/ML IJ SOLN
INTRAMUSCULAR | Status: AC
  Filled 2018-09-01: qty 0.5

## 2018-09-01 MED ORDER — ONDANSETRON HCL 4 MG/2ML IJ SOLN
INTRAMUSCULAR | Status: DC | PRN
  Administered 2018-09-01: 4 mg via INTRAVENOUS

## 2018-09-01 MED ORDER — LACTATED RINGERS IV SOLN
INTRAVENOUS | Status: DC
  Administered 2018-09-01 (×3): via INTRAVENOUS

## 2018-09-01 MED ORDER — SODIUM CHLORIDE 0.9% IV SOLUTION: Freq: Once | INTRAVENOUS | Status: DC

## 2018-09-01 MED ORDER — KETOROLAC TROMETHAMINE 30 MG/ML IJ SOLN
15.0000 mg | Freq: Once | INTRAMUSCULAR | Status: AC | PRN
  Administered 2018-09-01: 15 mg via INTRAVENOUS

## 2018-09-01 MED ORDER — FENTANYL CITRATE (PF) 100 MCG/2ML IJ SOLN
INTRAMUSCULAR | Status: AC
  Filled 2018-09-01: qty 2

## 2018-09-01 MED ORDER — PHENYLEPHRINE HCL-NACL 20-0.9 MG/250ML-% IV SOLN
INTRAVENOUS | Status: AC
  Filled 2018-09-01: qty 250

## 2018-09-01 MED ORDER — KETOROLAC TROMETHAMINE 30 MG/ML IJ SOLN
INTRAMUSCULAR | Status: AC
  Filled 2018-09-01: qty 1

## 2018-09-01 MED ORDER — ONDANSETRON HCL 4 MG/2ML IJ SOLN
INTRAMUSCULAR | Status: AC
  Filled 2018-09-01: qty 2

## 2018-09-01 SURGICAL SUPPLY — 36 items
BENZOIN TINCTURE PRP APPL 2/3 (GAUZE/BANDAGES/DRESSINGS) IMPLANT
CHLORAPREP W/TINT 26ML (MISCELLANEOUS) ×2 IMPLANT
CLAMP CORD UMBIL (MISCELLANEOUS) IMPLANT
CLOTH BEACON ORANGE TIMEOUT ST (SAFETY) ×2 IMPLANT
DRSG OPSITE POSTOP 4X10 (GAUZE/BANDAGES/DRESSINGS) ×2 IMPLANT
ELECT REM PT RETURN 9FT ADLT (ELECTROSURGICAL) ×2
ELECTRODE REM PT RTRN 9FT ADLT (ELECTROSURGICAL) ×1 IMPLANT
EXTRACTOR VACUUM BELL STYLE (SUCTIONS) IMPLANT
GLOVE BIOGEL PI IND STRL 6.5 (GLOVE) ×1 IMPLANT
GLOVE BIOGEL PI IND STRL 7.0 (GLOVE) ×2 IMPLANT
GLOVE BIOGEL PI INDICATOR 6.5 (GLOVE) ×1
GLOVE BIOGEL PI INDICATOR 7.0 (GLOVE) ×2
GLOVE ORTHOPEDIC STR SZ6.5 (GLOVE) ×2 IMPLANT
GOWN STRL REUS W/TWL LRG LVL3 (GOWN DISPOSABLE) ×6 IMPLANT
HEMOSTAT ARISTA ABSORB 3G PWDR (HEMOSTASIS) ×4 IMPLANT
KIT ABG SYR 3ML LUER SLIP (SYRINGE) IMPLANT
NEEDLE HYPO 22GX1.5 SAFETY (NEEDLE) ×2 IMPLANT
NEEDLE HYPO 25X1 1.5 SAFETY (NEEDLE) IMPLANT
NS IRRIG 1000ML POUR BTL (IV SOLUTION) ×2 IMPLANT
PACK C SECTION WH (CUSTOM PROCEDURE TRAY) ×2 IMPLANT
PAD ABD 8X10 STRL (GAUZE/BANDAGES/DRESSINGS) ×2 IMPLANT
PAD OB MATERNITY 4.3X12.25 (PERSONAL CARE ITEMS) ×2 IMPLANT
PENCIL SMOKE EVAC W/HOLSTER (ELECTROSURGICAL) ×2 IMPLANT
SPONGE GAUZE 4X4 12PLY STER LF (GAUZE/BANDAGES/DRESSINGS) ×4 IMPLANT
STRIP CLOSURE SKIN 1/2X4 (GAUZE/BANDAGES/DRESSINGS) IMPLANT
SUT MON AB 4-0 PS1 27 (SUTURE) ×2 IMPLANT
SUT PLAIN 2 0 (SUTURE) ×1
SUT PLAIN ABS 2-0 CT1 27XMFL (SUTURE) ×1 IMPLANT
SUT VIC AB 0 CT1 36 (SUTURE) ×4 IMPLANT
SUT VIC AB 0 CTX 36 (SUTURE) ×1
SUT VIC AB 0 CTX36XBRD ANBCTRL (SUTURE) ×1 IMPLANT
SYR CONTROL 10ML LL (SYRINGE) ×2 IMPLANT
TAPE CLOTH SURG 4X10 WHT LF (GAUZE/BANDAGES/DRESSINGS) ×2 IMPLANT
TOWEL OR 17X24 6PK STRL BLUE (TOWEL DISPOSABLE) ×2 IMPLANT
TRAY FOLEY W/BAG SLVR 14FR LF (SET/KITS/TRAYS/PACK) ×2 IMPLANT
WATER STERILE IRR 1000ML POUR (IV SOLUTION) ×2 IMPLANT

## 2018-09-01 NOTE — Anesthesia Procedure Notes (Signed)
Spinal  Patient location during procedure: OR Start time: 09/01/2018 8:55 PM End time: 09/01/2018 8:57 PM Staffing Anesthesiologist: Lyn Hollingshead, MD Performed: anesthesiologist  Preanesthetic Checklist Completed: patient identified, site marked, surgical consent, pre-op evaluation, timeout performed, IV checked, risks and benefits discussed and monitors and equipment checked Spinal Block Patient position: sitting Prep: site prepped and draped and DuraPrep Patient monitoring: continuous pulse ox and blood pressure Approach: midline Location: L3-4 Injection technique: single-shot Needle Needle type: Pencan  Needle gauge: 24 G Needle length: 10 cm Needle insertion depth: 4 cm Assessment Sensory level: T4

## 2018-09-01 NOTE — Anesthesia Preprocedure Evaluation (Addendum)
Anesthesia Evaluation  Patient identified by MRN, date of birth, ID band Patient awake    Reviewed: Allergy & Precautions, H&P , NPO status , Patient's Chart, lab work & pertinent test results  Airway Mallampati: I       Dental no notable dental hx. (+) Teeth Intact   Pulmonary neg pulmonary ROS, Current Smoker,    Pulmonary exam normal breath sounds clear to auscultation       Cardiovascular negative cardio ROS Normal cardiovascular exam Rhythm:Regular Rate:Normal     Neuro/Psych negative psych ROS   GI/Hepatic negative GI ROS, (+)     substance abuse  IV drug use,   Endo/Other  negative endocrine ROSdiabetes, Gestational, Oral Hypoglycemic Agents  Renal/GU negative Renal ROS     Musculoskeletal  (+) narcotic dependent  Abdominal Normal abdominal exam  (+)   Peds  Hematology   Anesthesia Other Findings   Reproductive/Obstetrics (+) Pregnancy                            Anesthesia Physical Anesthesia Plan  ASA: II  Anesthesia Plan: Spinal   Post-op Pain Management:    Induction:   PONV Risk Score and Plan: 2 and Ondansetron, Dexamethasone and Scopolamine patch - Pre-op  Airway Management Planned: Nasal Cannula and Natural Airway  Additional Equipment:   Intra-op Plan:   Post-operative Plan:   Informed Consent: I have reviewed the patients History and Physical, chart, labs and discussed the procedure including the risks, benefits and alternatives for the proposed anesthesia with the patient or authorized representative who has indicated his/her understanding and acceptance.       Plan Discussed with: CRNA  Anesthesia Plan Comments:        Anesthesia Quick Evaluation

## 2018-09-01 NOTE — Transfer of Care (Signed)
Immediate Anesthesia Transfer of Care Note  Patient: Cindy Watts  Procedure(s) Performed: CESAREAN SECTION (N/A )  Patient Location: PACU  Anesthesia Type:Spinal  Level of Consciousness: awake, alert  and oriented  Airway & Oxygen Therapy: Patient Spontanous Breathing  Post-op Assessment: Report given to RN and Post -op Vital signs reviewed and stable  Post vital signs: Reviewed and stable  Last Vitals:  Vitals Value Taken Time  BP 120/58 09/01/18 2210  Temp    Pulse 91 09/01/18 2212  Resp 16 09/01/18 2212  SpO2 98 % 09/01/18 2212  Vitals shown include unvalidated device data.  Last Pain:  Vitals:   09/01/18 2034  TempSrc: Oral         Complications: No apparent anesthesia complications

## 2018-09-01 NOTE — H&P (Signed)
Obstetric Preoperative History and Physical  Amberlie Gaillard is a 28 y.o. [id number] with IUP at 52w5dpresenting for leaking fluid for two days, no contractions. Scheduled for her 5th cesarean section.  No acute concerns.   *GDMA1 *IUGR *methadone maintenance *Anti-C antibody *h/o astrocytoma with shunt (removed 2017)  Prenatal Course Source of Care: [facility] with onset of care at 22 weeks Pregnancy complications or risks: Patient Active Problem List   Diagnosis Date Noted  . Gestational diabetes 07/11/2018  . [place]ioid dependence ([place] 07/10/2018  . Previous cesarean delivery affecting pregnancy, antepartum 06/10/2018  . Maternal atypical antibody complicating pregnancy, unspecified trimester, other fetus 06/10/2018  . Iron deficiency anemia of mother during pregnancy 06/01/2018  . Supervision of other normal pregnancy, antepartum 05/17/2018  . Late prenatal care, antepartum 05/17/2018   She plans to breastfeed, plans to bottle feed She desires condoms for postpartum contraception.   Prenatal labs and studies: ABO, Rh: B/Positive/-- (05/18 1131) Antibody: Positive, See Final Results (05/18 1131) Rubella: <0.90 (05/18 1131) RPR: Non Reactive (06/30 1040)  HBsAg: Negative (05/18 1131)  HIV: Non Reactive (06/30 1040)  GPVX:YIAXKPVV(08/11 0947) 3 hr Glucola: elevated Genetic screening not done Anatomy [facility]  Prenatal Transfer Tool  Fetal Ultrasounds or other Referrals:  Referred to Materal Fetal Medicine  Maternal Substance Abuse:  Yes:  Type: Methadone Significant Maternal Medications:  Meds include: Other: marijuana, methadone Significant Maternal Lab Results: Other: Anti-C antibody, has been getting cord dopplers  Past Medical History:  Diagnosis Date  . [place]   . Gestational diabetes   . Opioid abuse ([facility]     Past Surgical History:  Procedure Laterality Date  . APPENDECTOMY    . brain surgeries     x3  . CESAREAN SECTION      OB  History  Gravida Para Term Preterm AB Living  '5 4 4     4  ' SAB TAB Ectopic Multiple Live Births          4    # Outcome Date GA Lbr Len/2nd Weight Sex Delivery Anes PTL Lv  5 Current           4 Term 10/19/12 343w0d M CS-LTranv   LIV     Birth Comments: adoption  3 Term 10/22/08 3859w0dF CS-LTranv   LIV  2 Term 10/01/07 38w22w0d CS-LTranv EPI  LIV  1 Term 12/11/05 38w056w0dCS-LTranv Spinal  LIV     Complications: Anesthetic Complications    Social History   Socioeconomic History  . Marital status: Legally Separated    Spouse name: Not on file  . Number of children: Not on file  . Years of education: Not on file  . Highest education level: Not on file  Occupational History  . Not on file  Social Needs  . Financial resource strain: Not on file  . Food insecurity    Worry: Not on file    Inability: Not on file  . Transportation needs    Medical: Not on file    Non-medical: Not on file  Tobacco Use  . Smoking status: Current Every Day Smoker    Packs/day: 0.50    Types: Cigarettes  . Smokeless tobacco: Never Used  Substance and Sexual Activity  . Alcohol use: No    Frequency: Never  . Drug use: Not Currently    Types: Cocaine, Marijuana    Comment: currently on Methadone  .  Sexual activity: Yes    Birth control/protection: None  Lifestyle  . Physical activity    Days per week: Not on file    Minutes per session: Not on file  . Stress: Not on file  Relationships  . Social Herbalist on phone: Not on file    Gets together: Not on file    Attends religious service: Not on file    Active member of club or organization: Not on file    Attends meetings of clubs or organizations: Not on file    Relationship status: Not on file  Other Topics Concern  . Not on file  Social History Narrative  . Not on file    History reviewed. No pertinent family history.  Medications Prior to Admission  Medication Sig Dispense Refill Last Dose  . Accu-Chek  FastClix Lancets MISC 1 Units by Percutaneous route 4 (four) times daily. 100 each 12 09/01/2018 at Unknown time  . Blood Glucose Monitoring Suppl (ACCU-CHEK NANO SMARTVIEW) w/Device KIT 1 kit by Subdermal route as directed. Check blood sugars for fasting, and two hours after breakfast, lunch and dinner (4 checks daily) 1 kit 0 09/01/2018 at Unknown time  . Blood Pressure Monitoring KIT 1 kit by Does not apply route once a week. 1 kit 0 09/01/2018 at Unknown time  . ferrous sulfate (FERROUSUL) 325 (65 FE) MG tablet Take 1 tablet (325 mg total) by mouth 2 (two) times daily. 60 tablet 2 09/01/2018 at Unknown time  . Ferrous Sulfate (IRON PO) Take by mouth.   09/01/2018 at Unknown time  . glucose blood (ACCU-CHEK GUIDE) test strip Use to check blood sugars four times a day was instructed 50 each 12 09/01/2018 at Unknown time  . methadone (DOLOPHINE) 1 mg/ml oral solution Take 65 mg/kg by mouth daily.   09/01/2018 at Unknown time  . Prenatal Vit w/Fe-Methylfol-FA (PNV PO) Take by mouth.   09/01/2018 at Unknown time  . methadone (DOLOPHINE) 10 MG/ML solution Take 60 mg by mouth every 8 (eight) hours.     . metroNIDAZOLE (FLAGYL) 500 MG tablet Take 1 tablet (500 mg total) by mouth 2 (two) times daily. (Patient not taking: Reported on 07/24/2018) 14 tablet 0   . terconazole (TERAZOL 3) 0.8 % vaginal cream Place 1 applicator vaginally at bedtime. (Patient not taking: Reported on 07/24/2018) 20 g 0     No Known Allergies  Review of Systems: Negative except for what is mentioned in HPI.  Physical Exam: BP 110/75   Pulse 76   Temp 97.7 F (36.5 C)   Resp 16   LMP 12/11/2017   SpO2 100%  FHR by Doppler: 120 bpm CONSTITUTIONAL: Well-developed, well-nourished female in no acute distress.  HENT:  Normocephalic, atraumatic, External right and left ear normal. Oropharynx is clear and moist EYES: Conjunctivae and EOM are normal. Pupils are equal, round, and reactive to light. No scleral icterus.  NECK: Normal range  of motion, supple, no masses SKIN: Skin is warm and dry. No rash noted. Not diaphoretic. No erythema. No pallor. Naschitti: Alert and oriented to person, place, and time. Normal reflexes, muscle tone coordination. No cranial nerve deficit noted. PSYCHIATRIC: Normal mood and affect. Normal behavior. Normal judgment and thought content. CARDIOVASCULAR: Normal heart rate noted, regular rhythm RESPIRATORY: Effort and breath sounds normal, no problems with respiration noted ABDOMEN: Soft, nontender, nondistended, gravid. Well-healed Pfannenstiel incision. PELVIC: Deferred MUSCULOSKELETAL: Normal range of motion. No edema and no tenderness. 2+ distal pulses.  Pertinent Labs/Studies:   Results for orders placed or performed during the hospital encounter of 09/01/18 (from the past 72 hour(s))  POCT fern test     Status: Normal   Collection Time: 09/01/18  2:56 PM  Result Value Ref Range   POCT Fern Test Positive = ruptured amniotic membanes   Urinalysis, Routine w reflex microscopic     Status: Abnormal   Collection Time: 09/01/18  2:56 PM  Result Value Ref Range   Color, Urine YELLOW YELLOW   APPearance CLOUDY (A) CLEAR   Specific Gravity, Urine 1.014 1.005 - 1.030   pH 7.0 5.0 - 8.0   Glucose, UA NEGATIVE NEGATIVE mg/dL   Hgb urine dipstick NEGATIVE NEGATIVE   Bilirubin Urine NEGATIVE NEGATIVE   Ketones, ur NEGATIVE NEGATIVE mg/dL   Protein, ur NEGATIVE NEGATIVE mg/dL   Nitrite NEGATIVE NEGATIVE   Leukocytes,Ua TRACE (A) NEGATIVE   RBC / HPF 0-5 0 - 5 RBC/hpf   Bacteria, UA NONE SEEN NONE SEEN   Squamous Epithelial / LPF 0-5 0 - 5   Amorphous Crystal PRESENT     Comment: Performed at [facility], [address], [place], [place] [address]    Assessment and Plan :Delancey Moraes is a 28 y.o. [id number] at 62w5dbeing admitted for repeat c-section due to PROM. She has a history of c-section x4. Last ate 8 am, had a Boost around 1:30 pm.  The risks of cesarean section were  discussed with the patient; including but not limited to: infection which may require antibiotics; bleeding which may require transfusion or re-operation; injury to bowel, bladder, ureters or other surrounding organs; injury to the fetus; need for additional procedures including hysterectomy in the event of a life-threatening hemorrhage; placental abnormalities wth subsequent pregnancies, risk of needing c-sections in future pregnancies, incisional problems, thromboembolic phenomenon and other postoperative/anesthesia complications. Answered all questions. The patient verbalized understanding of the plan, giving informed consent for the procedure. She is agreeable to blood transfusion in the event of emergency.  Patient has been NPO since 8 am, she will remain NPO for procedure Anesthesia and OR aware Preoperative prophylactic antibiotics and SCDs ordered on call to the OR  To OR when ready 2 units of blood on hold   K. MArvilla Meres M.D. Attending [facility] [facility] Centerfor [facility] [place]

## 2018-09-01 NOTE — Discharge Summary (Addendum)
Postpartum Discharge Summary     Patient Name: Cindy Watts DOB: [birth year] MRN: [id number]  Date of admission: 09/01/2018 Delivering Provider: Sloan Leiter   Date of discharge: 09/04/2018  Admitting diagnosis: ctx eval, water broke Intrauterine pregnancy: [redacted]w[redacted]d    Secondary diagnosis:  Active Problems:   Previous cesarean delivery affecting pregnancy, antepartum   [place]ioid dependence ([place]   Gestational diabetes   Labor and delivery, indication for care  Additional problems:  *IUGR *Anti-C antibody *h/o astrocytoma with shunt (removed 2017)     Discharge diagnosis: Term Pregnancy Delivered and GDM A1                                                                                                Post partum procedures: Nexplanon, MMR  Augmentation: None  Complications: None  Hospital course:  Onset of Labor With Unplanned C/S  28y.o. yo [id number] 343w5das admitted in Latent Labor on 09/01/2018. Patient presented for leaking fluid for two days, no contractions. Membrane Rupture Time/Date: 4:00 PM ,08/30/2018   The patient went for cesarean section due to Elective Repeat, and delivered a Viable infant,09/01/2018  Details of operation can be found in separate operative note. Patient had an uncomplicated postpartum course.  She is ambulating,tolerating a regular diet, passing flatus, and urinating well.  Patient is discharged home in stable condition 09/04/18.  Magnesium Sulfate recieved: No BMZ received: No  Physical exam  Vitals:   09/03/18 0500 09/03/18 1302 09/03/18 2244 09/04/18 0525  BP: 115/81 (!) 87/65 106/71 108/80  Pulse: (!) 48 (!) 55 85 74  Resp: '16 17 18 18  ' Temp: 98.6 F (37 C) 98.3 F (36.8 C) 98.2 F (36.8 C) 98.2 F (36.8 C)  TempSrc: Oral Oral Oral Oral  SpO2: 99%   99%  Weight:      Height:       General: alert, cooperative and no distress Lochia: appropriate Uterine Fundus: firm Incision: Healing well with no significant drainage, No  significant erythema, Dressing is clean, dry, and intact DVT Evaluation: No evidence of DVT seen on physical exam. Negative Homan's sign. No cords or calf tenderness. No significant calf/ankle edema. Labs: Lab Results  Component Value Date   WBC 9.3 09/02/2018   HGB 10.0 (L) 09/02/2018   HCT 30.7 (L) 09/02/2018   MCV 81.2 09/02/2018   PLT 252 09/02/2018   CMP Latest Ref Rng & Units 09/02/2018  Creatinine 0.44 - 1.00 mg/dL 0.70    Discharge instruction: per After Visit Summary and "Baby and Me Booklet".  After visit meds:  Allergies as of 09/04/2018   No Known Allergies     Medication List    TAKE these medications   Accu-Chek FastClix Lancets Misc 1 Units by Percutaneous route 4 (four) times daily.   Accu-Chek Guide test strip Generic drug: glucose blood Use to check blood sugars four times a day was instructed   Accu-Chek Nano SmartView w/Device Kit 1 kit by Subdermal route as directed. Check blood sugars for fasting, and two hours after breakfast, lunch and dinner (4 checks daily)  Blood Pressure Monitoring Kit 1 kit by Does not apply route once a week.   ferrous sulfate 325 (65 FE) MG tablet Commonly known as: FerrouSul Take 1 tablet (325 mg total) by mouth 2 (two) times daily.   ibuprofen 600 MG tablet Commonly known as: ADVIL Take 1 tablet (600 mg total) by mouth every 6 (six) hours as needed for cramping.   IRON PO Take by mouth.   methadone 10 MG/ML solution Commonly known as: DOLOPHINE Take 60 mg by mouth every 8 (eight) hours.   methadone 1 mg/ml  oral solution Commonly known as: DOLOPHINE Take 65 mg/kg by mouth daily.   metroNIDAZOLE 500 MG tablet Commonly known as: FLAGYL Take 1 tablet (500 mg total) by mouth 2 (two) times daily.   oxyCODONE-acetaminophen 5-325 MG tablet Commonly known as: PERCOCET/ROXICET Take 1-2 tablets by mouth every 4 (four) hours as needed for moderate pain.   PNV PO Take by mouth.   terconazole 0.8 % vaginal  cream Commonly known as: Terazol 3 Place 1 applicator vaginally at bedtime.       Diet: routine diet  Activity: Advance as tolerated. Pelvic rest for 6 weeks.   Outpatient follow up:4 weeks Follow up Appt: Future Appointments  Date Time Provider Long  09/18/2018 10:45 AM Constant, Vickii Chafe, MD [place] None  10/04/2018  9:00 AM Sloan Leiter, MD [place] None  10/15/2018  9:00 AM [facility] None   Follow up Visit:   Please schedule this patient for Postpartum visit in: 4 weeks with the following provider: MD For C/S patients schedule nurse incision check in weeks 2 weeks: yes High risk pregnancy complicated by: GDM and repeat section x5 Delivery mode:  CS Anticipated Birth Control: undecided  PP Procedures needed: 2 hour GTT and incision check  Schedule Integrated BH visit: yes    Newborn Data: APGAR (1 MIN): 9   APGAR (5 MINS): 26   Female Weight: 7471 g  Newborn Delivery   Birth date/time: 09/01/2018 21:28:00 Delivery type: C-Section, Low Transverse Trial of labor: No C-section categorization: Repeat      Baby Feeding: Bottle and Breast Disposition:rooming in   09/04/2018 Liyah Higham L Armistead Sult, DO

## 2018-09-01 NOTE — Op Note (Addendum)
Cindy Watts PROCEDURE DATE: 09/01/2018  PREOPERATIVE DIAGNOSES: Intrauterine pregnancy at 105w5d weeks gestation; SROM, history of 4 prior CS  POSTOPERATIVE DIAGNOSES: The same  PROCEDURE: Repeat diagonal incision Cesarean Section  SURGEON:  Dr. Sloan Leiter  ASSISTANT:  Barrington Ellison, MD  An experienced assistant was required given the standard of surgical care given the complexity of the case.  This assistant was needed for exposure, dissection, suctioning, retraction, instrument exchange, assisting with delivery with administration of fundal pressure, and for overall help during the procedure.   ANESTHESIOLOGY TEAM: Anesthesiologist: Lyn Hollingshead, MD CRNA: Riki Sheer, CRNA  INDICATIONS: Cindy Watts is a 28 y.o. [phone] at [redacted]w[redacted]d here for cesarean section secondary to the indications listed under preoperative diagnoses; please see preoperative note for further details.  The risks of cesarean section were discussed with the patient including but were not limited to: bleeding which may require transfusion or reoperation; infection which may require antibiotics; injury to bowel, bladder, ureters or other surrounding organs; injury to the fetus; need for additional procedures including hysterectomy in the event of a life-threatening hemorrhage; placental abnormalities wth subsequent pregnancies, incisional problems, thromboembolic phenomenon and other postoperative/anesthesia complications.   The patient concurred with the proposed plan, giving informed written consent for the procedure.    FINDINGS:  Viable female infant in cephalic presentation. Clear amniotic fluid.  Intact placenta, three vessel cord. Bladder tacked onto lower uterine segment. Transverse hysterotomy bluntly dissected with left aspect traveling towards left fundus, with appearance of prior incision. Very thin lower uterine segment. Normal fallopian tubes and ovaries bilaterally. APGAR (1 MIN): 9   APGAR  (5 MINS): 9   APGAR (10 MINS):    ANESTHESIA: Spinal INTRAVENOUS FLUIDS: 1300 ml   ESTIMATED BLOOD LOSS: 595 ml URINE OUTPUT:  645 ml SPECIMENS: Placenta sent to L&D COMPLICATIONS: None immediate  PROCEDURE IN DETAIL:  The patient preoperatively received intravenous antibiotics and had sequential compression devices applied to her lower extremities.  She was then taken to the operating room where spinal anesthesia was administered and was found to be adequate. She was then placed in a dorsal supine position with a leftward tilt, and prepped and draped in a sterile manner.  A foley catheter was placed into her bladder and attached to constant gravity.  After an adequate timeout was performed, a Pfannenstiel skin incision was made with scalpel on her preexisting scar and carried through to the underlying layer of fascia. The fascia was incised in the midline, and this incision was extended bilaterally using the Mayo scissors.  Kocher clamps were applied to the superior aspect of the fascial incision and the underlying rectus muscles were dissected off bluntly and sharply.  A similar process was carried out on the inferior aspect of the fascial incision. The rectus muscles were separated in the midline and the peritoneum was entered sharply and then bluntly. No adhesions from uterus to anterior abdominal wall. Minimal adhesions from omentum to anterior abdominal wall. The Alexis self-retaining retractor was introduced into the abdominal cavity.  Bladder flap created. Attention was turned to the lower uterine segment where a low transverse hysterotomy was made with a scalpel and extended bilaterally bluntly. Very thin lower uterine segment. Transverse incision made that dissected diagonally towards left fundus, with appearance of where prior incision had been made.  The infant was successfully delivered from LOA, the cord was clamped and cut after one minute, and the infant was handed over to the awaiting  neonatology team. Uterine massage was  then administered, and the placenta delivered intact with a three-vessel cord. The uterus was then cleared of clots and debris.  The hysterotomy was closed with 0 Vicryl in a running locked fashion. The pelvis was cleared of all clot and debris. Arista applied to hysterotomy and where bladder flap was made, underlying rectus muscles and superior edge of bladder. Hemostasis was confirmed on all surfaces.  The retractor was removed.  The peritoneum was closed with a 0 Vicryl running stitch. The fascia was then closed using 0 PDS in a running fashion.  The subcutaneous layer was irrigated, reapproximated with 2-0 plain gut interrupted stitches, and the skin was closed with a 4-0 Monocryl subcuticular stitch. 0.5% Marcaine injected around skin incision. The patient tolerated the procedure well. Sponge, instrument and needle counts were correct x 3.  She was taken to the recovery room in stable condition.   Barrington Ellison, MD OB Family Medicine Fellow, [facility] for [employer], [place] of Attending Supervision of [place] Fellow: Evaluation, management, and procedures were performed by the [facility] Fellow under my supervision and collaboration. I was scrubbed and present for all portions of this procedure. I agree with the documentation and plan.  Feliz Beam, M.D. Attending Center for [employer] (Faculty Practice)  09/01/2018 11:23 PM

## 2018-09-01 NOTE — MAU Provider Note (Signed)
First Provider Initiated Contact with Patient 09/01/18 1506     S: [name] is a 28 y.o. [id number] at [redacted]w[redacted]d  who presents to MAU today complaining of leaking of fluid since two days ago. She denies vaginal bleeding. She denies contractions. She reports normal fetal movement.    O: BP 110/75   Pulse 76   Temp 97.7 F (36.5 C)   Resp 16   LMP 12/11/2017   SpO2 100%  GENERAL: Well-developed, well-nourished female in no acute distress.  HEAD: Normocephalic, atraumatic.  CHEST: Normal effort of breathing, regular heart rate ABDOMEN: Soft, nontender, gravid PELVIC: Normal external female genitalia. Vagina is pink and rugated. Cervix with normal contour, no lesions. Normal discharge.  Minimal pooling.   Cervical exam:  Dilation: Fingertip Effacement (%): Thick Exam by:: Vernice Jefferson NP   Fetal Monitoring: reactive with minimal variability Baseline: 115/120 Variability: minimal/moderate Accelerations: present, 15x15 Decelerations: absent Contractions: irregular  Results for orders placed or performed during the hospital encounter of 09/01/18 (from the past 24 hour(s))  POCT fern test     Status: Normal   Collection Time: 09/01/18  2:56 PM  Result Value Ref Range   POCT Fern Test Positive = ruptured amniotic membanes   Urinalysis, Routine w reflex microscopic     Status: Abnormal   Collection Time: 09/01/18  2:56 PM  Result Value Ref Range   Color, Urine YELLOW YELLOW   APPearance CLOUDY (A) CLEAR   Specific Gravity, Urine 1.014 1.005 - 1.030   pH 7.0 5.0 - 8.0   Glucose, UA NEGATIVE NEGATIVE mg/dL   Hgb urine dipstick NEGATIVE NEGATIVE   Bilirubin Urine NEGATIVE NEGATIVE   Ketones, ur NEGATIVE NEGATIVE mg/dL   Protein, ur NEGATIVE NEGATIVE mg/dL   Nitrite NEGATIVE NEGATIVE   Leukocytes,Ua TRACE (A) NEGATIVE   RBC / HPF 0-5 0 - 5 RBC/hpf   Bacteria, UA NONE SEEN NONE SEEN   Squamous Epithelial / LPF 0-5 0 - 5   Amorphous Crystal PRESENT     A: SIUP at  [redacted]w[redacted]d  SROM  P: -admit to L&D for repeat C/S  Waynetta Metheny, Gerrie Nordmann, NP 09/01/2018 3:28 PM

## 2018-09-01 NOTE — MAU Note (Signed)
.   Cindy Watts is a 28 y.o. at [redacted]w[redacted]d here in MAU reporting: leakage of  Fluid for a couple of days. Denies any vaginal bleeding or contractions    Onset of complaint: couple of days Pain score: 0 Vitals:   09/01/18 1442  BP: 110/75  Pulse: 76  Resp: 16  Temp: 97.7 F (36.5 C)  SpO2: 100%     FHT:145 Lab orders placed from triage: UA

## 2018-09-02 ENCOUNTER — Encounter (HOSPITAL_COMMUNITY): Payer: Self-pay

## 2018-09-02 LAB — CBC
HCT: 30.7 % — ABNORMAL LOW (ref 36.0–46.0)
Hemoglobin: 10 g/dL — ABNORMAL LOW (ref 12.0–15.0)
MCH: 26.5 pg (ref 26.0–34.0)
MCHC: 32.6 g/dL (ref 30.0–36.0)
MCV: 81.2 fL (ref 80.0–100.0)
Platelets: 252 10*3/uL (ref 150–400)
RBC: 3.78 MIL/uL — ABNORMAL LOW (ref 3.87–5.11)
RDW: 15.4 % (ref 11.5–15.5)
WBC: 9.3 10*3/uL (ref 4.0–10.5)
nRBC: 0 % (ref 0.0–0.2)

## 2018-09-02 LAB — CREATININE, SERUM
Creatinine, Ser: 0.7 mg/dL (ref 0.44–1.00)
GFR calc Af Amer: >60 mL/min (ref >60)
GFR calc non Af Amer: >60 mL/min (ref >60)

## 2018-09-02 LAB — RPR: RPR Ser Ql: NONREACTIVE

## 2018-09-02 MED ORDER — DIPHENHYDRAMINE HCL 25 MG PO CAPS: 25.0000 mg | ORAL_CAPSULE | Freq: Four times a day (QID) | ORAL | Status: DC | PRN

## 2018-09-02 MED ORDER — PRENATAL MULTIVITAMIN CH
1.0000 | ORAL_TABLET | Freq: Every day | ORAL | Status: DC
  Administered 2018-09-02 – 2018-09-04 (×3): 1 via ORAL
  Filled 2018-09-02 (×3): qty 1

## 2018-09-02 MED ORDER — ZOLPIDEM TARTRATE 5 MG PO TABS: 5.0000 mg | ORAL_TABLET | Freq: Every evening | ORAL | Status: DC | PRN

## 2018-09-02 MED ORDER — ENOXAPARIN SODIUM 40 MG/0.4ML ~~LOC~~ SOLN
40.0000 mg | SUBCUTANEOUS | Status: DC
  Administered 2018-09-02 – 2018-09-04 (×3): 40 mg via SUBCUTANEOUS
  Filled 2018-09-02 (×3): qty 0.4

## 2018-09-02 MED ORDER — MENTHOL 3 MG MT LOZG: 1.0000 | LOZENGE | OROMUCOSAL | Status: DC | PRN

## 2018-09-02 MED ORDER — OXYTOCIN 40 UNITS IN NORMAL SALINE INFUSION - SIMPLE MED: 2.5000 [IU]/h | INTRAVENOUS | Status: AC

## 2018-09-02 MED ORDER — WITCH HAZEL-GLYCERIN EX PADS: 1.0000 "application " | MEDICATED_PAD | CUTANEOUS | Status: DC | PRN

## 2018-09-02 MED ORDER — SIMETHICONE 80 MG PO CHEW
80.0000 mg | CHEWABLE_TABLET | Freq: Three times a day (TID) | ORAL | Status: DC
  Administered 2018-09-02 – 2018-09-04 (×8): 80 mg via ORAL
  Filled 2018-09-02 (×8): qty 1

## 2018-09-02 MED ORDER — SIMETHICONE 80 MG PO CHEW
80.0000 mg | CHEWABLE_TABLET | ORAL | Status: DC
  Administered 2018-09-02 – 2018-09-03 (×3): 80 mg via ORAL
  Filled 2018-09-02 (×3): qty 1

## 2018-09-02 MED ORDER — SIMETHICONE 80 MG PO CHEW: 80.0000 mg | CHEWABLE_TABLET | ORAL | Status: DC | PRN

## 2018-09-02 MED ORDER — METHADONE HCL 10 MG PO TABS
60.0000 mg | ORAL_TABLET | Freq: Three times a day (TID) | ORAL | Status: DC
  Administered 2018-09-02 – 2018-09-04 (×9): 60 mg via ORAL
  Filled 2018-09-02 (×9): qty 6

## 2018-09-02 MED ORDER — DIBUCAINE (PERIANAL) 1 % EX OINT: 1.0000 "application " | TOPICAL_OINTMENT | CUTANEOUS | Status: DC | PRN

## 2018-09-02 MED ORDER — SENNOSIDES-DOCUSATE SODIUM 8.6-50 MG PO TABS
2.0000 | ORAL_TABLET | ORAL | Status: DC
  Administered 2018-09-02 – 2018-09-03 (×3): 2 via ORAL
  Filled 2018-09-02 (×3): qty 2

## 2018-09-02 MED ORDER — NICOTINE 7 MG/24HR TD PT24
7.0000 mg | MEDICATED_PATCH | Freq: Every day | TRANSDERMAL | Status: DC
  Administered 2018-09-02 – 2018-09-04 (×3): 7 mg via TRANSDERMAL
  Filled 2018-09-02 (×4): qty 1

## 2018-09-02 MED ORDER — IBUPROFEN 600 MG PO TABS
600.0000 mg | ORAL_TABLET | Freq: Three times a day (TID) | ORAL | Status: DC | PRN
  Administered 2018-09-02 – 2018-09-03 (×3): 600 mg via ORAL
  Filled 2018-09-02 (×3): qty 1

## 2018-09-02 MED ORDER — TETANUS-DIPHTH-ACELL PERTUSSIS 5-2.5-18.5 LF-MCG/0.5 IM SUSP: 0.5000 mL | Freq: Once | INTRAMUSCULAR | Status: DC

## 2018-09-02 MED ORDER — COCONUT OIL OIL
1.0000 "application " | TOPICAL_OIL | Status: DC | PRN
  Administered 2018-09-02: 1 via TOPICAL

## 2018-09-02 MED ORDER — LACTATED RINGERS IV SOLN
INTRAVENOUS | Status: DC
  Administered 2018-09-02: 09:00:00 via INTRAVENOUS

## 2018-09-02 MED ORDER — OXYCODONE-ACETAMINOPHEN 5-325 MG PO TABS
1.0000 | ORAL_TABLET | ORAL | Status: DC | PRN
  Administered 2018-09-02 – 2018-09-04 (×13): 2 via ORAL
  Filled 2018-09-02 (×4): qty 2
  Filled 2018-09-02: qty 1
  Filled 2018-09-02 (×2): qty 2
  Filled 2018-09-02: qty 1
  Filled 2018-09-02 (×6): qty 2

## 2018-09-02 NOTE — Lactation Note (Signed)
This note was copied from a baby's chart. Lactation Consultation Note  Patient Name: Cindy Watts [id number] Date: 09/02/2018 Reason for consult: Initial assessment;Infant < 6lbs;Early term 37-38.6wks;Maternal endocrine disorder;Other (Comment)(Mom currently on methadone hx of drug usuage) Type of Endocrine Disorder?: Diabetes P5, 6 hour female infant. Mom with hx GDM and currently on methadone mom has hx of drug usage has UDS scheduled.  Per mom, infant latched well in L&D for 20 minutes but not been latching to breast mom made two attempts. Mom attempted latch infant on left breast using cross cradle hold, infant open's mouth wide and only holds breast in mouth at this time. LC discussed hand expression and mom taught back and infant given 10 ml of colostrum by spoon. Mom will continue to work towards latching infant to breast. LC discussed with mom infant small size and that supplement may be needed after breastfeeding infant possibly EBM. Mom will use DEBP every 3 hours for 15 minutes on initial setting Mom shown how to use DEBP & how to disassemble, clean, & reassemble parts.. Mom started pumping while LC in room and easily pumped out 6 ml and was still pumping when [name] left room. Mom knows to breastfeed infant according hunger cues, 8 to 12 times within 24 hours and on demand. Reviewed Baby & Me book's Breastfeeding Basics.  Mom made aware of O/P services, breastfeeding support groups, community resources, and our phone # for post-discharge questions.  Mom's plan: 1. Breastfeed infant according hunger cues, 8 to 12 times within 24 hours and on demand. 2. Mom will continue to work towards latching infant to breast. 3. Mom will hand express or use DEBP and give infant back EBM according infant's age/ hours of life. 4. Mom will use DEBP every 3 hours for 15 minutes on initial setting.   Maternal Data Formula Feeding for Exclusion: No Has patient been taught Hand Expression?: Yes(Infant  given 10 ml of EBM by spoon.) Does the patient have breastfeeding experience prior to this delivery?: Yes  Feeding Feeding Type: Breast Fed  LATCH Score Latch: Too sleepy or reluctant, no latch achieved, no sucking elicited.  Audible Swallowing: None  Type of Nipple: Everted at rest and after stimulation  Comfort (Breast/Nipple): Soft / non-tender  Hold (Positioning): Assistance needed to correctly position infant at breast and maintain latch.  LATCH Score: 5  Interventions Interventions: Breast feeding basics reviewed;Assisted with latch;Skin to skin;Breast massage;Hand express;Expressed milk;Position options;Support pillows;DEBP;Adjust position;Breast compression  Lactation Tools Discussed/Used WIC Program: Yes Pump Review: Setup, frequency, and cleaning;Milk Storage Initiated by:: [name], IBCLC Date initiated:: 09/02/18   Consult Status Consult Status: Follow-up Date: 09/02/18 Follow-up type: In-patient    [name] 09/02/2018, 4:12 AM

## 2018-09-02 NOTE — Progress Notes (Signed)
Faculty Attending Note  Post Op Day 1  Subjective: Patient is feeling well. She reports moderately well controlled pain on PO pain meds. She is ambulating and denies light-headedness or dizziness. She has foley catheter in place. She is passing flatus, has not had a BM yet. She is tolerating a regular diet without nausea/vomiting. Bleeding is moderate. She is breast & bottle feeding. Baby is in room and doing well.  Objective: Blood pressure 105/76, pulse 67, temperature 97.9 F (36.6 C), temperature source Oral, resp. rate 20, height 5\' 2"  (1.575 m), weight 55.5 kg, last menstrual period 12/11/2017, SpO2 98 %, unknown if currently breastfeeding. Temp:  [97.7 F (36.5 C)-98.2 F (36.8 C)] 97.9 F (36.6 C) (08/23 0500) Pulse Rate:  [59-78] 67 (08/23 0100) Resp:  [16-26] 20 (08/23 0500) BP: (105-122)/(58-86) 105/76 (08/23 0100) SpO2:  [98 %-100 %] 98 % (08/23 0500) Weight:  [55.5 kg] 55.5 kg (08/22 2029)  Physical Exam:  General: alert, oriented, cooperative Chest: normal respiratory effort Heart: RRR  Abdomen: soft, appropriately tender to palpation, incision covered by dressing with no evidence of active bleeding  Uterine Fundus: firm, 2 fingers below the umbilicus Lochia: moderate, rubra DVT Evaluation: no evidence of DVT Extremities: no edema, no calf tenderness  UOP: 100 mL/hr clear yellow urine  Recent Labs    09/01/18 1614 09/02/18 0249  HGB 11.9* 10.0*  HCT 36.5 30.7*    Assessment/Plan: Patient Active Problem List   Diagnosis Date Noted  . Labor and delivery, indication for care 09/02/2018  . Gestational diabetes 07/11/2018  . Opioid dependence ([place]) 07/10/2018  . Previous cesarean delivery affecting pregnancy, antepartum 06/10/2018  . Maternal atypical antibody complicating pregnancy, unspecified trimester, other fetus 06/10/2018  . Iron deficiency anemia of mother during pregnancy 06/01/2018  . Supervision of other normal pregnancy, antepartum 05/17/2018  .  Late prenatal care, antepartum 05/17/2018    Patient is 28 y.o. [id number] POD#1 s/p RCS at [redacted]w[redacted]d for PROM. Course complicated by methdone use and GDMA1. She is doing well, recovering appropriately and complains only of some soreness. Briefly reviewed neonatal withdrawal, importance of breastfeeding and potential length of newborn stay, patient in agreement with plan.  Continue routine post partum care Pain meds prn Regular diet Lovenox 40 mg daily [place] condoms for birth control methdone 65 mg daily    K. Arvilla Meres, M.D. Attending [facility] for [employer] (Faculty Practice)  09/02/2018, 7:41 AM

## 2018-09-02 NOTE — Clinical Social Work Maternal (Signed)
CLINICAL SOCIAL WORK MATERNAL/CHILD NOTE  Patient Details  Name: Cindy Watts MRN: [id number] Date of Birth: 06/10/1990  Date:  09/02/2018  Clinical Social Worker Initiating Note:  Manroop Jakubowicz, LCSW     Date/Time: Initiated:  09/02/18/1438             Child's Name:  Giovanni Spalliero   Biological Parents:  Mother, Father(Father: Angelo Spalliero)   Need for Interpreter:  None   Reason for Referral:  Current Substance Use/Substance Use During Pregnancy    Address:  [address] [place] [address] Plans on moving soon, didn't recall new address    Phone number:  [phone] (home)     Additional phone number:   Household Members/Support Persons (HM/SP):   Household Member/Support Person 1   HM/SP Name Relationship DOB or Age  HM/SP -1 Angelo Spalliero FOB    HM/SP -2        HM/SP -3        HM/SP -4        HM/SP -5        HM/SP -6        HM/SP -7        HM/SP -8          Natural Supports (not living in the home):     Professional Supports:Other (Comment)([facility])   Employment:Unemployed   Type of Work:     Education:  9 to 11 years(10th Grade)   Homebound arranged: No  Financial Resources:Medicare    Other Resources: WIC, Food Stamps    Cultural/Religious Considerations Which May Impact Care:   Strengths: Ability to meet basic needs , Home prepared for child    Psychotropic Medications:         Pediatrician:       Pediatrician List:       [place]    [place]    [place]    [place]    [place]      Pediatrician Fax Number:    Risk Factors/Current Problems: Substance Use    Cognitive State: Able to Concentrate , Alert , Linear Thinking , Goal Oriented , Insightful    Mood/Affect: Interested , Calm    CSW Assessment:CSW met with MOB at bedside to discuss consult for medication  management (Methadone) and hospital drug policy, FOB present. CSW asked FOB to leave the room to speak with MOB alone, FOB left voluntarily. CSW introduced self and explained reason for consult. MOB was welcoming and engaged during assessment. MOB reported that she resides with FOB and that they are moving any day now. MOB was unable to recall their new address. MOB reported that she is unemployed and receives both WIC and food stamps. MOB reported that she has all items needed to care for infant including car seat and a pack and play/basinet. MOB reported that she has four older children that don't live with her. MOB reported that she has a son named Ethan last name unknown born 10-19-12 that she gave up for adoption. MOB reported that she has a daughter named Savannah Pavkovich born 12-11-05 that resides with her father (Joshua Pavkovich) in [place] that she hasn't had contact with since she was 28 years old, noting she was in and out of jail. MOB reported that she has a son (Tyler Mcnairy 10/01/07) and daughter (Alexia Kubin 10/22/08) that resides with their father (Matthew Carne) in [place], . MOB reported that she speaks with them   daily and went to visit them a few weeks ago.   CSW inquired about MOB's mental health history, MOB denied any mental health history. MOB denied any history of postpartum depression. MOB presented calm and did not demonstrate any acute mental health signs/symptoms. CSW assessed for safety, MOB denied SI, HI and domestic violence.   CSW provided education regarding the baby blues period vs. perinatal mood disorders, discussed treatment and gave resources for mental health follow up if concerns arise.  CSW recommends self-evaluation during the postpartum time period using the New Mom Checklist from Postpartum Progress and encouraged MOB to contact a medical professional if symptoms are noted at any time.    CSW provided review of Sudden Infant Death Syndrome  (SIDS) precautions.    CSW informed MOB about hospital drug policy due to medication mantainence program. MOB confirmed that she is currently taking Methadone managed by [facility] and she goes daily. MOB reported that she has been on methadone for 3 to 4 months and that it is working. CSW inquired about MOB's substance use prior to Methadone, MOB reported that she used fentanyl. MOB initially reported that her last use of fentanyl was 4 months ago, then reported "I'm not going to lie it was about 2 months ago". MOB reported that she was missing days at the clinic when she used fentanyl due to the sick feeling. CSW thanked MOB for honesty and informed MOB that infant's UDS was negative and CDS would continue to monitored and a CPS report would be made if warranted. MOB verbalized understanding and asked appropriate questions regarding CPS. CSW and MOB spoke about infant being under NAS protocol due to her Methadone, MOB verbalized understanding. MOB denied any additional needs/concerns at this time.  CSW will continue to monitor CDS and make a CPS report if warranted.   CSW Plan/Description: Sudden Infant Death Syndrome (SIDS) Education, Perinatal Mood and Anxiety Disorder (PMADs) Education, Hospital Drug Screen Policy Information, CSW Will Continue to Monitor Umbilical Cord Tissue Drug Screen Results and Make Report if Warranted, Neonatal Abstinence Syndrome (NAS) Education    Damyia Strider L Barnet Benavides, LCSW 09/02/2018, 2:45 PM           

## 2018-09-02 NOTE — Anesthesia Postprocedure Evaluation (Signed)
Anesthesia Post Note  Patient: Cindy Watts  Procedure(s) Performed: CESAREAN SECTION (N/A )     Patient location during evaluation: PACU Anesthesia Type: Spinal Level of consciousness: awake Pain management: pain level controlled Vital Signs Assessment: post-procedure vital signs reviewed and stable Respiratory status: spontaneous breathing Cardiovascular status: stable Postop Assessment: no headache, no backache, spinal receding, patient able to bend at knees and no apparent nausea or vomiting Anesthetic complications: no    Last Vitals:  Vitals:   09/01/18 2345 09/01/18 2352  BP: 107/80 110/83  Pulse: 60 64  Resp: 19 16  Temp: 36.7 C   SpO2: 98% 98%    Last Pain:  Vitals:   09/01/18 2352  TempSrc:   PainSc: 1    Pain Goal:    LLE Motor Response: Purposeful movement (09/01/18 2345) LLE Sensation: Tingling (09/01/18 2345) RLE Motor Response: Purposeful movement (09/01/18 2345) RLE Sensation: Tingling (09/01/18 2345)     Epidural/Spinal Function Cutaneous sensation: Able to Wiggle Toes (09/01/18 2345), Patient able to flex knees: Yes (09/01/18 2345), Patient able to lift hips off bed: Yes (09/01/18 2345), Back pain beyond tenderness at insertion site: No (09/01/18 2345), Progressively worsening motor and/or sensory loss: No (09/01/18 2345), Bowel and/or bladder incontinence post epidural: No (09/01/18 2345)  Huston Foley

## 2018-09-02 NOTE — Progress Notes (Signed)
Patient feels that she has a sinus infection. Reports that green mucous is coming out from nose. Called MD. MD to see patient.

## 2018-09-03 DIAGNOSIS — Z30017 Encounter for initial prescription of implantable subdermal contraceptive: Secondary | ICD-10-CM

## 2018-09-03 MED ORDER — LIDOCAINE HCL 1 % IJ SOLN
0.0000 mL | Freq: Once | INTRAMUSCULAR | Status: AC | PRN
  Administered 2018-09-03: 20 mL via INTRADERMAL

## 2018-09-03 MED ORDER — LIDOCAINE HCL 1 % IJ SOLN
INTRAMUSCULAR | Status: AC
  Filled 2018-09-03: qty 20

## 2018-09-03 MED ORDER — ETONOGESTREL 68 MG ~~LOC~~ IMPL
68.0000 mg | DRUG_IMPLANT | Freq: Once | SUBCUTANEOUS | Status: AC
  Administered 2018-09-03: 68 mg via SUBCUTANEOUS
  Filled 2018-09-03: qty 1

## 2018-09-03 MED ORDER — FLUTICASONE PROPIONATE 50 MCG/ACT NA SUSP
2.0000 | Freq: Every day | NASAL | Status: DC
  Administered 2018-09-03 – 2018-09-04 (×2): 2 via NASAL
  Filled 2018-09-03: qty 16

## 2018-09-03 MED ORDER — IBUPROFEN 600 MG PO TABS
600.0000 mg | ORAL_TABLET | Freq: Four times a day (QID) | ORAL | Status: DC | PRN
  Administered 2018-09-03 – 2018-09-04 (×4): 600 mg via ORAL
  Filled 2018-09-03 (×4): qty 1

## 2018-09-03 MED ORDER — GABAPENTIN 100 MG PO CAPS
100.0000 mg | ORAL_CAPSULE | Freq: Three times a day (TID) | ORAL | Status: DC
  Administered 2018-09-03 – 2018-09-04 (×5): 100 mg via ORAL
  Filled 2018-09-03 (×5): qty 1

## 2018-09-03 MED ORDER — PNEUMOCOCCAL VAC POLYVALENT 25 MCG/0.5ML IJ INJ
0.5000 mL | INJECTION | INTRAMUSCULAR | Status: AC
  Administered 2018-09-03: 0.5 mL via INTRAMUSCULAR
  Filled 2018-09-03: qty 0.5

## 2018-09-03 MED ORDER — IBUPROFEN 200 MG PO TABS: 400.0000 mg | ORAL_TABLET | Freq: Three times a day (TID) | ORAL | Status: DC

## 2018-09-03 NOTE — Progress Notes (Addendum)
POSTPARTUM PROGRESS NOTE  Post Partum Day 2 Subjective:  Cindy Watts is a 28 y.o. [id number] [redacted]w[redacted]d s/p RCS.  No acute events overnight.  Pt denies problems with ambulating, voiding or po intake.  She denies nausea or vomiting.  Pain is moderately well controlled.  She has had flatus. She has not had bowel movement.  Lochia Small.   Objective: Blood pressure 115/81, pulse (!) 48, temperature 98.6 F (37 C), temperature source Oral, resp. rate 16, height 5\' 2"  (1.575 m), weight 55.5 kg, last menstrual period 12/11/2017, SpO2 99 %, unknown if currently breastfeeding.  Physical Exam:  General: alert, cooperative and no distress Lochia:normal flow Chest: CTAB Heart: RRR no m/r/g Abdomen: +BS, soft, nontender,  Uterine Fundus: firm DVT Evaluation: No calf swelling or tenderness Extremities: no edema  Recent Labs    09/01/18 1614 09/02/18 0249  HGB 11.9* 10.0*  HCT 36.5 30.7*    Assessment/Plan:  ASSESSMENT: Cindy Watts is a 28 y.o. [id number] [redacted]w[redacted]d s/p RCS  Plan for discharge tomorrow and Contraception Nexplanon outpatient   Sinus congestion: flonase Pain control: Gabapentin 100 TID CBGs: well controlled    LOS: 2 days   Matilde Haymaker, MD 09/03/2018, 10:12 AM   I confirm that I have verified the information documented in the resident's note and that I have also personally reperformed the history, physical exam and all medical decision making activities of this service and have verified that all service and findings are accurately documented in this resident's note.   Nexplanon was placed in hospital on 09/03/18.     Elvera Maria, CNM 09/03/2018 9:52 PM

## 2018-09-03 NOTE — Lactation Note (Signed)
This note was copied from a baby's chart. Lactation Consultation Note  Patient Name: Cindy Watts [id number] Date: 09/03/2018 Reason for consult: Follow-up assessment;Other (Comment);Infant < 6lbs;Early term 37-38.6wks(eat sleep console)   Mom called out for assistance with pumping due to discomfort.  LC noticed moms nipples rubbing in 24 flange, LC provided mom with 27 flange.  Mom felt more comfortable with these.  Coconut oil was also provided and shown how to apply to flange.    Baby cueing while mom pumped one side, LC assisted in latching infant.  Infant had wide gape and swallows were heard.  Good rhythmic jaw movement noted and infant fell asleep after actively sucking for 10 minutes.    LC reviewed BF basics with mom.  Hand exp. Reviewed.  Mom collected 15 ml from DEBP, milk placed in refrigerator for later feed.    Mom is encouraged to do lots of STS, feed with cues, and call out for assistance with feeds if needed.  All questions answered.  Infant asleep STS with mom.  [place] praised mom for efforts with pumping and breastfeeding infant.     Maternal Data    Feeding Feeding Type: Breast Fed  LATCH Score Latch: Repeated attempts needed to sustain latch, nipple held in mouth throughout feeding, stimulation needed to elicit sucking reflex.  Audible Swallowing: Spontaneous and intermittent  Type of Nipple: Everted at rest and after stimulation  Comfort (Breast/Nipple): Filling, red/small blisters or bruises, mild/mod discomfort(tenderness is nipple area)  Hold (Positioning): Assistance needed to correctly position infant at breast and maintain latch.  LATCH Score: 7  Interventions Interventions: Breast feeding basics reviewed;Assisted with latch;Skin to skin;Breast massage;Breast compression;Adjust position;DEBP;Coconut oil;Expressed milk;Position options;Support pillows  Lactation Tools Discussed/Used Tools: Flanges Flange Size: 27   Consult Status Consult Status:  Follow-up Date: 09/04/18 Follow-up type: In-patient    Ferne Coe [facility] 09/03/2018, 4:45 PM

## 2018-09-03 NOTE — Progress Notes (Signed)

## 2018-09-04 ENCOUNTER — Encounter: Payer: Medicare PPO | Admitting: Obstetrics & Gynecology

## 2018-09-04 MED ORDER — IBUPROFEN 600 MG PO TABS: 600.0000 mg | ORAL_TABLET | Freq: Four times a day (QID) | ORAL | 0 refills | Status: AC | PRN

## 2018-09-04 MED ORDER — MEASLES, MUMPS & RUBELLA VAC IJ SOLR
0.5000 mL | Freq: Once | INTRAMUSCULAR | Status: AC
  Administered 2018-09-04: 0.5 mL via SUBCUTANEOUS
  Filled 2018-09-04: qty 0.5

## 2018-09-04 MED ORDER — OXYCODONE-ACETAMINOPHEN 5-325 MG PO TABS: 1.0000 | ORAL_TABLET | ORAL | 0 refills | Status: AC | PRN

## 2018-09-04 NOTE — Discharge Instructions (Signed)

## 2018-09-04 NOTE — Lactation Note (Signed)
This note was copied from a baby's chart. Lactation Consultation Note: Infant is 41. 5 weeks. Infant is less than 6 lbs and is at 6% weight loss.   Mother reports that she is breastfeeding well. Mother is breastfeeding when I arrived in the room.mother denies having any discomfort with feeding or pumping today.   Mother reports that she pumped 15 ml and is supplementing infant .  Mother has DEBP sat up at the bedside. She also has a harmony hand pump at the bedside.  Mother reports that she is active with [facility] .  A WIC referral was faxed.  Mother is aware that [facility] services and community support.   Patient Name: Cindy Watts [id number] Date: 09/04/2018     Maternal Data    Feeding    LATCH Score                   Interventions    Lactation Tools Discussed/Used     Consult Status      Jess Barters [facility] 09/04/2018, 5:06 PM

## 2018-09-05 LAB — BPAM RBC
Blood Product Expiration Date: 202008312359
Blood Product Expiration Date: 202009032359
Unit Type and Rh: 7300
Unit Type and Rh: 7300

## 2018-09-05 LAB — TYPE AND SCREEN
ABO/RH(D): B POS
Antibody Screen: POSITIVE
Donor AG Type: NEGATIVE
Donor AG Type: NEGATIVE
Unit division: 0
Unit division: 0

## 2018-09-05 NOTE — Care Management Important Message (Signed)
Important Message  Patient Details  Name: Cindy Watts MRN: [id number] Date of Birth: [birth year]   Medicare Important Message Given:  Yes     Shelda Altes 09/05/2018, 12:27 PM

## 2018-09-06 ENCOUNTER — Ambulatory Visit: Payer: Self-pay

## 2018-09-06 ENCOUNTER — Ambulatory Visit (HOSPITAL_COMMUNITY): Payer: Medicare PPO

## 2018-09-06 ENCOUNTER — Encounter (HOSPITAL_COMMUNITY): Payer: Self-pay

## 2018-09-06 NOTE — Lactation Note (Signed)
This note was copied from a baby's chart. Lactation Consultation Note  Patient Name: Boy Shevy Yaney [id number] Date: 09/06/2018    Infant has only lost 6 g over the last 24 hours (he had 3 stools & 3 voids during that time). Mom says she hears gulping when he is at the breast. She feels that infant is full when he gets off the breast, which is why she does not offer a bottle after every feeding at the breast.  Mom has concerns about bottle feeding b/c she doesn't want infant to prefer the bottle over the breast (Mom feels that his initial latch is a little different than it used to be). Mom has been using slow-flow nipples; I provided her with extra-slow flow nipples.   Mom's milk has come to volume. She is using the size 27 flanges & says she has room around her nipple to move back & forth when pumping. Mom says she only has initial tenderness with latching.  The hand-out from the CDC, "How to Keep Your Breast Pump Kit Clean," was provided to Mom with instructions on sanitizing pump parts. I also wrote down milk storage times per her request. Mom understands that while she is fortifying, she should not let milk sit at room temperature after expressing it.   Mom has declined the [facility] loaner at this time b/c the FOB had bought her one from [employer]. Mom knows she can call us after discharge to get a [facility] loaner if she changes her mind.    Matthias Hughs [facility] 09/06/2018, 10:56 AM

## 2018-09-08 ENCOUNTER — Inpatient Hospital Stay (HOSPITAL_COMMUNITY)
Admission: RE | Admit: 2018-09-08 | Discharge: 2018-09-08 | Disposition: A | Discharge status: Home / Self Care | Payer: Medicare PPO | Source: Ambulatory Visit

## 2018-09-10 ENCOUNTER — Inpatient Hospital Stay (HOSPITAL_COMMUNITY)
Admission: RE | Admit: 2018-09-10 | Payer: Medicaid Other | Source: Ambulatory Visit | Admitting: Obstetrics and Gynecology

## 2018-09-18 ENCOUNTER — Ambulatory Visit: Payer: Medicare PPO | Admitting: Obstetrics and Gynecology

## 2018-09-21 ENCOUNTER — Ambulatory Visit (INDEPENDENT_AMBULATORY_CARE_PROVIDER_SITE_OTHER): Payer: Medicare PPO | Admitting: Medical

## 2018-09-21 ENCOUNTER — Other Ambulatory Visit: Payer: Self-pay

## 2018-09-21 ENCOUNTER — Encounter: Payer: Self-pay | Admitting: Medical

## 2018-09-21 VITALS — BP 106/62 | HR 82 | Ht 62.0 in | Wt 107.3 lb

## 2018-09-21 DIAGNOSIS — L0291 Cutaneous abscess, unspecified: Secondary | ICD-10-CM

## 2018-09-21 MED ORDER — CEPHALEXIN 500 MG PO CAPS: 500.0000 mg | ORAL_CAPSULE | Freq: Four times a day (QID) | ORAL | 0 refills | Status: AC

## 2018-09-21 NOTE — Progress Notes (Signed)
Incision check Healing well  Abscess draining on buttocks   Rx Keflex   History:  [name] is a 28 y.o. [id number] who presents to clinic today for incision check 2.5 weeks after 5th RLTCS. The patient denies any issues with her incision. She denies bleeding, drainage, increased pain or swelling. She is concerned about a boil on her buttocks that is unrelated.   The following portions of the patient's history were reviewed and updated as appropriate: allergies, current medications, family history, past medical history, social history, past surgical history and problem list.  Review of Systems:  Review of Systems  Constitutional: Negative for chills and fever.  Gastrointestinal: Negative for abdominal pain.      Objective:  Physical Exam BP 106/62   Pulse 82   Ht 5\' 2"  (1.575 m)   Wt 107 lb 4.8 oz (48.7 kg)   Breastfeeding Yes   BMI 19.63 kg/m  Physical Exam  Nursing note and vitals reviewed. Constitutional: She is oriented to person, place, and time. She appears well-developed and well-nourished. No distress.  HENT:  Head: Normocephalic and atraumatic.  Cardiovascular: Normal rate.  Respiratory: Effort normal.  GI: Soft. She exhibits no distension and no mass. There is no abdominal tenderness. There is no rebound and no guarding.  Incision is well-healed without erythema, edema, bleeding or drainage  Neurological: She is alert and oriented to person, place, and time.  Skin: Skin is warm and dry. No erythema.     Psychiatric: She has a normal mood and affect.    Assessment & Plan:  1. S/P Cesarean section - Healing well - Patient to make appointment for routine PP visit in 2-4 weeks    2. Abscess - cephALEXin (KEFLEX) 500 MG capsule; Take 1 capsule (500 mg total) by mouth 4 (four) times daily.  Dispense: 28 capsule; Refill: 0 - Patient advised of warning signs for worsening condition - Patient encouraged to go directly to [facility] for further evaluation and  management if symptoms worsen, fever develops or symptoms do not improve by tomorrow  Luvenia Redden, PA-C 09/21/2018 2:07 PM

## 2018-09-21 NOTE — Progress Notes (Signed)
Presents for incision check, reports no problems today.

## 2018-09-21 NOTE — Patient Instructions (Signed)

## 2018-10-04 ENCOUNTER — Encounter: Payer: Self-pay | Admitting: Obstetrics and Gynecology

## 2018-10-04 ENCOUNTER — Telehealth (INDEPENDENT_AMBULATORY_CARE_PROVIDER_SITE_OTHER): Payer: Medicare PPO | Admitting: Obstetrics and Gynecology

## 2018-10-04 DIAGNOSIS — O99320 Drug use complicating pregnancy, unspecified trimester: Secondary | ICD-10-CM

## 2018-10-04 DIAGNOSIS — Z3480 Encounter for supervision of other normal pregnancy, unspecified trimester: Secondary | ICD-10-CM

## 2018-10-04 DIAGNOSIS — Z98891 History of uterine scar from previous surgery: Secondary | ICD-10-CM

## 2018-10-04 DIAGNOSIS — O24419 Gestational diabetes mellitus in pregnancy, unspecified control: Secondary | ICD-10-CM

## 2018-10-04 NOTE — Progress Notes (Signed)
TELEHEALTH POSTPARTUM VIRTUAL VIDEO VISIT ENCOUNTER NOTE   Provider location: Center for [facility] at [place]   I connected with [name] on 10/04/18 at  9:00 AM EDT by MyChart Video Encounter at home and verified that I am speaking with the correct person using two identifiers.    I discussed the limitations, risks, security and privacy concerns of performing an evaluation and management service virtually and the availability of in person appointments. I also discussed with the patient that there may be a patient responsible charge related to this service. The patient expressed understanding and agreed to proceed.  Chief Complaint: Postpartum Visit  History of Present Illness: Cindy Watts is a 28 y.o. Caucasian G5P5005 being evaluated for postpartum followup.    She is s/p repeat cesarean section on 09/01/18 at 37 weeks after SROM; she was discharged to home on POD#3. Pregnancy complicated by methadone maintenance, IUGR, gDMA1, h/o astrocytoma with shunt. Baby is doing well.  Complains of worrying about milk supply. Baby is growing, eating well but she is having a hard time pumping, states she gets very little out when she pumps. Baby is gaining weight and getting satiated though.  Vaginal bleeding or discharge: No  Intercourse: No  Contraception: Nexplanon Mode of feeding infant: Bottle and Breast PP depression s/s: No .  Any bowel or bladder issues: No  Pap smear: no abnormalities (date: 02/2018 per OB records from Dr. Marvel Plan, but they do not have actual lab report)  Review of Systems: Positive for n/a. Her 12 point review of systems is negative or as noted in the History of Present Illness.  Patient Active Problem List   Diagnosis Date Noted  . Labor and delivery, indication for care 09/02/2018  . Gestational diabetes 07/11/2018  . Opioid dependence ([place]) 07/10/2018  . Previous cesarean delivery affecting pregnancy, antepartum 06/10/2018  .  Maternal atypical antibody complicating pregnancy, unspecified trimester, other fetus 06/10/2018  . Iron deficiency anemia of mother during pregnancy 06/01/2018  . Supervision of other normal pregnancy, antepartum 05/17/2018  . Late prenatal care, antepartum 05/17/2018    Medications Corinna Lines. Aymond "[name]" had no medications administered during this visit. Current Outpatient Medications  Medication Sig Dispense Refill  . Accu-Chek FastClix Lancets MISC 1 Units by Percutaneous route 4 (four) times daily. 100 each 12  . Blood Glucose Monitoring Suppl (ACCU-CHEK NANO SMARTVIEW) w/Device KIT 1 kit by Subdermal route as directed. Check blood sugars for fasting, and two hours after breakfast, lunch and dinner (4 checks daily) 1 kit 0  . Blood Pressure Monitoring KIT 1 kit by Does not apply route once a week. 1 kit 0  . cephALEXin (KEFLEX) 500 MG capsule Take 1 capsule (500 mg total) by mouth 4 (four) times daily. 28 capsule 0  . ferrous sulfate (FERROUSUL) 325 (65 FE) MG tablet Take 1 tablet (325 mg total) by mouth 2 (two) times daily. 60 tablet 2  . Ferrous Sulfate (IRON PO) Take by mouth.    Marland Kitchen glucose blood (ACCU-CHEK GUIDE) test strip Use to check blood sugars four times a day was instructed 50 each 12  . ibuprofen (ADVIL) 600 MG tablet Take 1 tablet (600 mg total) by mouth every 6 (six) hours as needed for cramping. 30 tablet 0  . methadone (DOLOPHINE) 1 mg/ml oral solution Take 65 mg/kg by mouth daily.    . methadone (DOLOPHINE) 10 MG/ML solution Take 60 mg by mouth every 8 (eight) hours.    . metroNIDAZOLE (FLAGYL)  500 MG tablet Take 1 tablet (500 mg total) by mouth 2 (two) times daily. (Patient not taking: Reported on 07/24/2018) 14 tablet 0  . oxyCODONE-acetaminophen (PERCOCET/ROXICET) 5-325 MG tablet Take 1-2 tablets by mouth every 4 (four) hours as needed for moderate pain. 30 tablet 0  . Prenatal Vit w/Fe-Methylfol-FA (PNV PO) Take by mouth.    . terconazole (TERAZOL 3) 0.8 % vaginal  cream Place 1 applicator vaginally at bedtime. (Patient not taking: Reported on 07/24/2018) 20 g 0   No current facility-administered medications for this visit.     Allergies Patient has no known allergies.  Physical Exam:  Breastfeeding Yes   General:  Alert, oriented and cooperative. Patient is in no acute distress.  Mental Status: Normal mood and affect. Normal behavior. Normal judgment and thought content.   Respiratory: Normal respiratory effort noted, no problems with respiration noted  Rest of physical exam deferred due to type of encounter  PP Depression Screening:   Edinburgh Postnatal Depression Scale Screening Tool 10/04/2018 09/03/2018 09/03/2018  I have been able to laugh and see the funny side of things. 0 (No Data) (No Data)  I have looked forward with enjoyment to things. 0 - -  I have blamed myself unnecessarily when things went wrong. 0 - -  I have been anxious or worried for no good reason. 0 - -  I have felt scared or panicky for no good reason. 0 - -  Things have been getting on top of me. 0 - -  I have been so unhappy that I have had difficulty sleeping. 0 - -  I have felt sad or miserable. 0 - -  I have been so unhappy that I have been crying. 0 - -  The thought of harming myself has occurred to me. 0 - -  Edinburgh Postnatal Depression Scale Total 0 - -     Assessment:Patient is a 28 y.o. [name] who is 4 weeks postpartum from a repeat cesarean section.  She is doing well.   Plan:  1. Gestational diabetes mellitus (GDM) in third trimester, gestational diabetes method of control unspecified Needs pp 2 hr GTT  2. Supervision of other normal pregnancy, antepartum Worried about supply, since baby is growing and getting satiated, reviewed it sounds like a pump issue, she may just not be pumping a lot, recommended continued pumping  3. H/O: C-section Doing well, no complaints  4. Substance abuse affecting pregnancy, antepartum Cont methadone  maintenence  5. Postpartum state Doing well nexplanon in place   RTC 4 weeks for 2 hr GTT  I discussed the assessment and treatment plan with the patient. The patient was provided an opportunity to ask questions and all were answered. The patient agreed with the plan and demonstrated an understanding of the instructions.   The patient was advised to call back or seek an in-person evaluation/go to the ED for any concerning postpartum symptoms.  I provided 20 minutes of face-to-face time during this encounter.   Sloan Leiter, MD [facility] for [place], [place]

## 2018-10-15 ENCOUNTER — Other Ambulatory Visit: Payer: Medicare PPO

## 2018-10-16 ENCOUNTER — Telehealth: Payer: Self-pay | Admitting: Obstetrics

## 2018-10-26 ENCOUNTER — Telehealth: Payer: Self-pay

## 2018-10-26 NOTE — Telephone Encounter (Signed)
Patient called requesting a referral request to [facility].   Patient states she doesn't have a PCP but needs referral.   Henderson Cloud, referral coordinator for our office called and spoke with [facility] and they stated she needs a referral from a PCP not [place] who is considered a specialty.   Patient called back and made aware of this. Patient given numbers to [facility] [place] offices so she can try to establish care with them. Kathrene Alu RN

## 2018-10-29 ENCOUNTER — Other Ambulatory Visit: Payer: Medicare PPO

## 2018-11-06 ENCOUNTER — Telehealth: Payer: Self-pay | Admitting: Obstetrics

## 2018-11-13 ENCOUNTER — Ambulatory Visit: Payer: Medicare PPO | Admitting: Obstetrics and Gynecology

## 2018-11-13 DIAGNOSIS — Z01419 Encounter for gynecological examination (general) (routine) without abnormal findings: Secondary | ICD-10-CM

## 2019-01-12 ENCOUNTER — Emergency Department (HOSPITAL_COMMUNITY): Payer: Medicare HMO

## 2019-01-12 ENCOUNTER — Inpatient Hospital Stay (HOSPITAL_COMMUNITY)
Admission: EM | Admit: 2019-01-12 | Discharge: 2019-01-13 | DRG: 918 | Discharge status: Against Medical Advice | Payer: Medicare HMO | Attending: Internal Medicine | Admitting: Internal Medicine

## 2019-01-12 ENCOUNTER — Other Ambulatory Visit: Payer: Self-pay

## 2019-01-12 ENCOUNTER — Inpatient Hospital Stay (HOSPITAL_COMMUNITY): Payer: Medicare HMO

## 2019-01-12 DIAGNOSIS — F1910 Other psychoactive substance abuse, uncomplicated: Secondary | ICD-10-CM

## 2019-01-12 DIAGNOSIS — R000 Tachycardia, unspecified: Secondary | ICD-10-CM | POA: Diagnosis not present

## 2019-01-12 DIAGNOSIS — R4182 Altered mental status, unspecified: Secondary | ICD-10-CM

## 2019-01-12 DIAGNOSIS — B1920 Unspecified viral hepatitis C without hepatic coma: Secondary | ICD-10-CM | POA: Diagnosis not present

## 2019-01-12 DIAGNOSIS — G92 Toxic encephalopathy: Secondary | ICD-10-CM | POA: Diagnosis not present

## 2019-01-12 DIAGNOSIS — Z20822 Contact with and (suspected) exposure to covid-19: Secondary | ICD-10-CM | POA: Diagnosis not present

## 2019-01-12 DIAGNOSIS — Z03818 Encounter for observation for suspected exposure to other biological agents ruled out: Secondary | ICD-10-CM | POA: Diagnosis not present

## 2019-01-12 DIAGNOSIS — D509 Iron deficiency anemia, unspecified: Secondary | ICD-10-CM

## 2019-01-12 DIAGNOSIS — F419 Anxiety disorder, unspecified: Secondary | ICD-10-CM | POA: Diagnosis not present

## 2019-01-12 DIAGNOSIS — T50901A Poisoning by unspecified drugs, medicaments and biological substances, accidental (unintentional), initial encounter: Secondary | ICD-10-CM

## 2019-01-12 DIAGNOSIS — T507X4A Poisoning by analeptics and opioid receptor antagonists, undetermined, initial encounter: Secondary | ICD-10-CM | POA: Diagnosis not present

## 2019-01-12 DIAGNOSIS — T50991A Poisoning by other drugs, medicaments and biological substances, accidental (unintentional), initial encounter: Secondary | ICD-10-CM | POA: Diagnosis not present

## 2019-01-12 DIAGNOSIS — R55 Syncope and collapse: Secondary | ICD-10-CM | POA: Diagnosis not present

## 2019-01-12 DIAGNOSIS — L02414 Cutaneous abscess of left upper limb: Secondary | ICD-10-CM | POA: Diagnosis present

## 2019-01-12 DIAGNOSIS — T50904A Poisoning by unspecified drugs, medicaments and biological substances, undetermined, initial encounter: Secondary | ICD-10-CM

## 2019-01-12 DIAGNOSIS — R400 Somnolence: Secondary | ICD-10-CM | POA: Diagnosis not present

## 2019-01-12 LAB — COMPREHENSIVE METABOLIC PANEL
ALT: 33 U/L (ref 0–44)
AST: 31 U/L (ref 15–41)
Albumin: 4 g/dL (ref 3.5–5.0)
Alkaline Phosphatase: 56 U/L (ref 38–126)
Anion gap: 9 (ref 5–15)
BUN: 16 mg/dL (ref 6–20)
CO2: 25 mmol/L (ref 22–32)
Calcium: 9.4 mg/dL (ref 8.9–10.3)
Chloride: 104 mmol/L (ref 98–111)
Creatinine, Ser: 0.66 mg/dL (ref 0.44–1.00)
GFR calc Af Amer: >60 mL/min (ref >60)
GFR calc non Af Amer: >60 mL/min (ref >60)
Glucose, Bld: 76 mg/dL (ref 70–99)
Potassium: 3.8 mmol/L (ref 3.5–5.1)
Sodium: 138 mmol/L (ref 135–145)
Total Bilirubin: 0.6 mg/dL (ref 0.3–1.2)
Total Protein: 7.4 g/dL (ref 6.5–8.1)

## 2019-01-12 LAB — SARS CORONAVIRUS 2 (TAT 6-24 HRS): SARS Coronavirus 2: NEGATIVE

## 2019-01-12 LAB — SALICYLATE LEVEL: Salicylate Lvl: <7 mg/dL — ABNORMAL LOW (ref 7.0–30.0)

## 2019-01-12 LAB — CBC WITH DIFFERENTIAL/PLATELET
Abs Immature Granulocytes: 0.02 10*3/uL (ref 0.00–0.07)
Basophils Absolute: 0 10*3/uL (ref 0.0–0.1)
Basophils Relative: 0 %
Eosinophils Absolute: 0.1 10*3/uL (ref 0.0–0.5)
Eosinophils Relative: 1 %
HCT: 35 % — ABNORMAL LOW (ref 36.0–46.0)
Hemoglobin: 10.8 g/dL — ABNORMAL LOW (ref 12.0–15.0)
Immature Granulocytes: 0 %
Lymphocytes Relative: 30 %
Lymphs Abs: 2.6 10*3/uL (ref 0.7–4.0)
MCH: 23.9 pg — ABNORMAL LOW (ref 26.0–34.0)
MCHC: 30.9 g/dL (ref 30.0–36.0)
MCV: 77.4 fL — ABNORMAL LOW (ref 80.0–100.0)
Monocytes Absolute: 0.4 10*3/uL (ref 0.1–1.0)
Monocytes Relative: 4 %
Neutro Abs: 5.6 10*3/uL (ref 1.7–7.7)
Neutrophils Relative %: 65 %
Platelets: 303 10*3/uL (ref 150–400)
RBC: 4.52 MIL/uL (ref 3.87–5.11)
RDW: 14.8 % (ref 11.5–15.5)
WBC: 8.6 10*3/uL (ref 4.0–10.5)
nRBC: 0 % (ref 0.0–0.2)

## 2019-01-12 LAB — RAPID URINE DRUG SCREEN, HOSP PERFORMED
Amphetamines: POSITIVE — AB
Barbiturates: NOT DETECTED
Benzodiazepines: POSITIVE — AB
Cocaine: POSITIVE — AB
Opiates: POSITIVE — AB
Tetrahydrocannabinol: NOT DETECTED

## 2019-01-12 LAB — VITAMIN B12: Vitamin B-12: 446 pg/mL (ref 180–914)

## 2019-01-12 LAB — IRON AND TIBC
Iron: 45 ug/dL (ref 28–170)
Saturation Ratios: 11 % (ref 10.4–31.8)
TIBC: 397 ug/dL (ref 250–450)
UIBC: 352 ug/dL

## 2019-01-12 LAB — I-STAT BETA HCG BLOOD, ED (MC, WL, AP ONLY): I-stat hCG, quantitative: <5 m[IU]/mL (ref <5)

## 2019-01-12 LAB — TSH: TSH: 0.374 u[IU]/mL (ref 0.350–4.500)

## 2019-01-12 LAB — ACETAMINOPHEN LEVEL: Acetaminophen (Tylenol), Serum: <10 ug/mL — ABNORMAL LOW (ref 10–30)

## 2019-01-12 LAB — PHOSPHORUS: Phosphorus: 3.8 mg/dL (ref 2.5–4.6)

## 2019-01-12 LAB — MAGNESIUM: Magnesium: 2.1 mg/dL (ref 1.7–2.4)

## 2019-01-12 LAB — ETHANOL: Alcohol, Ethyl (B): <10 mg/dL (ref <10)

## 2019-01-12 MED ORDER — ONDANSETRON HCL 4 MG/2ML IJ SOLN: 4.0000 mg | Freq: Four times a day (QID) | INTRAMUSCULAR | Status: DC | PRN

## 2019-01-12 MED ORDER — NALOXONE HCL 2 MG/2ML IJ SOSY
PREFILLED_SYRINGE | INTRAMUSCULAR | Status: AC
  Administered 2019-01-12: 2 mg
  Filled 2019-01-12: qty 2

## 2019-01-12 MED ORDER — THIAMINE HCL 100 MG/ML IJ SOLN
Freq: Once | INTRAVENOUS | Status: AC
  Filled 2019-01-12: qty 1000

## 2019-01-12 MED ORDER — ONDANSETRON HCL 4 MG PO TABS: 4.0000 mg | ORAL_TABLET | Freq: Four times a day (QID) | ORAL | Status: DC | PRN

## 2019-01-12 MED ORDER — DEXTROSE-NACL 5-0.2 % IV SOLN: INTRAVENOUS | Status: DC

## 2019-01-12 MED ORDER — CHLORHEXIDINE GLUCONATE CLOTH 2 % EX PADS
6.0000 | MEDICATED_PAD | Freq: Every day | CUTANEOUS | Status: DC
  Administered 2019-01-13: 6 via TOPICAL

## 2019-01-12 MED ORDER — SODIUM CHLORIDE 0.9 % IV BOLUS
1000.0000 mL | Freq: Once | INTRAVENOUS | Status: AC
  Administered 2019-01-12: 1000 mL via INTRAVENOUS

## 2019-01-12 MED ORDER — NALOXONE HCL 4 MG/10ML IJ SOLN
2.0000 mg/h | INTRAVENOUS | Status: DC
  Administered 2019-01-12: 2 mg/h via INTRAVENOUS
  Filled 2019-01-12 (×2): qty 10

## 2019-01-12 NOTE — ED Triage Notes (Signed)
`  Patient was found in a parking lot at [facility] unresponsive. Patient received Narcan 2 mg x 2 intranasally prior to coming to the ED. CBG-79.

## 2019-01-12 NOTE — H&P (Signed)
.  History and Physical    Cindy Watts [id number] DOB: 07/10/1988 DOA: 01/12/2019  PCP: No primary care provider on file.  Patient coming from: Restaurant parking lot   Chief Complaint: AMS  HPI: Cindy Watts is a 29 y.o. female with unknown medical history. Found passed out in restaurant parking lot. Seen by EMS and narcan administered. Temporarily alert but not coherent. This was repeated en route to ED and in the ED itself. She was able to protect airway and respirations. She was placed on narcan gtt. [facility] was called for admission.   Review of Systems: unable to obtain d/t mentation  Unable to obtain PMHx, PSHx d/t mentation. Unable to obtain SocHx or FamHx d/t mentation  Prior to Admission medications   Not on File    Physical Exam: Vitals:   01/12/19 1330 01/12/19 1400 01/12/19 1430 01/12/19 1500  BP: 110/84 114/79 109/81 124/85  Pulse: 92 100 99 100  Resp: (!) 26 18 (!) 23 (!) 22  Temp:      TempSrc:      SpO2: 100% 100% 100% 100%    Constitutional: 29 y.o. female NAD, calm, comfortable Vitals:   01/12/19 1330 01/12/19 1400 01/12/19 1430 01/12/19 1500  BP: 110/84 114/79 109/81 124/85  Pulse: 92 100 99 100  Resp: (!) 26 18 (!) 23 (!) 22  Temp:      TempSrc:      SpO2: 100% 100% 100% 100%   General: 29 y.o. female resting in bed in NAD Eyes: PERRL, normal sclera ENMT: Nares patent w/o discharge, orophaynx clear, dentition normal, ears w/o discharge/lesions/ulcers Cardiovascular: RRR, +S1, S2, no m/g/r, equal pulses throughout Respiratory: CTABL, no w/r/r, normal WOB GI: BS+, NDNT, no masses noted, no organomegaly noted MSK: No e/c/c Skin: No rashes, bruises, ulcerations noted, multiple tattoos and what appears to be track marks Neuro: somnolent, stirs to noxious stim  Labs on Admission: I have personally reviewed following labs and imaging studies  CBC: Recent Labs  Lab 01/12/19 1255  WBC 8.6  NEUTROABS 5.6  HGB 10.8*  HCT 35.0*  MCV  77.4*  PLT [id number]   Basic Metabolic Panel: Recent Labs  Lab 01/12/19 1255  NA 138  K 3.8  CL 104  CO2 25  GLUCOSE 76  BUN 16  CREATININE 0.66  CALCIUM 9.4   GFR: CrCl cannot be calculated (Unknown ideal weight.). Liver Function Tests: Recent Labs  Lab 01/12/19 1255  AST 31  ALT 33  ALKPHOS 56  BILITOT 0.6  PROT 7.4  ALBUMIN 4.0   No results for input(s): LIPASE, AMYLASE in the last 168 hours. No results for input(s): AMMONIA in the last 168 hours. Coagulation Profile: No results for input(s): INR, PROTIME in the last 168 hours. Cardiac Enzymes: No results for input(s): CKTOTAL, CKMB, CKMBINDEX, TROPONINI in the last 168 hours. BNP (last 3 results) No results for input(s): PROBNP in the last 8760 hours. HbA1C: No results for input(s): HGBA1C in the last 72 hours. CBG: No results for input(s): GLUCAP in the last 168 hours. Lipid Profile: No results for input(s): CHOL, HDL, LDLCALC, TRIG, CHOLHDL, LDLDIRECT in the last 72 hours. Thyroid Function Tests: No results for input(s): TSH, T4TOTAL, FREET4, T3FREE, THYROIDAB in the last 72 hours. Anemia Panel: No results for input(s): VITAMINB12, FOLATE, FERRITIN, TIBC, IRON, RETICCTPCT in the last 72 hours. Urine analysis: No results found for: COLORURINE, APPEARANCEUR, LABSPEC, PHURINE, GLUCOSEU, HGBUR, BILIRUBINUR, KETONESUR, PROTEINUR, UROBILINOGEN, NITRITE, LEUKOCYTESUR  Radiological Exams on Admission: DG Chest Portable 1  View  Result Date: 01/12/2019 CLINICAL DATA:  Substance abuse. EXAM: PORTABLE CHEST 1 VIEW COMPARISON:  None. FINDINGS: The heart size and mediastinal contours are within normal limits for AP technique. Low volumes but otherwise lungs are clear. No pneumothorax or large pleural effusion. No acute finding in the visualized skeleton. IMPRESSION: No acute cardiopulmonary finding. Electronically Signed   By: Audie Pinto M.D.   On: 01/12/2019 13:21    EKG: Independently reviewed. Sinus  tach  Assessment/Plan Active Problems:   Drug overdose   AMS (altered mental status)   Microcytic anemia   Polysubstance abuse (HCC)   AMS Drug Overdose Polysubstance Abuse    - found in parking lot passed out    - per ED report, required 6 mg narcan en route and in ED w/ intermittent alertness (screaming, agitation)     - currently on narcan gtt and maintaining respirations     - no family contacts on file     - UDS positive for benzos, coke, opiates, amphetamines     - continue narcan gtt, give fluids, check Mg2+, B6, B12     - check CTH     - admit to inpatient, stepdown  Microcytic anemia     - check TSH, iron studies   DVT prophylaxis: SCDs  Code Status: FULL  Family Communication: None at bedside  Disposition Plan: TBD  Consults called: None  Admission status: Inpatient. Need for IV narcan, monitoring respiratory status, ongoing w/u    Jonnie Finner DO [facility]  If 7PM-7AM, please contact night-coverage [email or web address]  01/12/2019, 3:34 PM

## 2019-01-12 NOTE — ED Provider Notes (Signed)
[facility] DEPT Provider Note   CSN: [id number] Arrival date & time: 01/12/19  1225     History No chief complaint on file.  Level 5 caveat due to altered mental status  Cindy Watts is a 29 y.o. female presents brought in by EMS for evaluation of acute onset, persistent altered mental status.  [employer] called out, found patient in a parking lot unresponsive with sonorous respirations.  She received 2 mg of Narcan intranasally with some improvement in mental status temporarily but became drowsy and unarousable again while on route to the hospital so she received a second dose of 2mg  IN Narcan. On my assessment she is drowsy, does not answer questions or follow commands. EJ access placed by Dr. Laverta Baltimore, and she received 1mg  Narcan IV with temporary improvement in mental status, followed by another 1mg  dose IV. She has evidence of track marks on all extremities.   The history is provided by the EMS personnel. The history is limited by the condition of the patient.       No past medical history on file.  Patient Active Problem List   Diagnosis Date Noted  . Drug overdose 01/12/2019  . AMS (altered mental status) 01/12/2019  . Microcytic anemia 01/12/2019  . Polysubstance abuse ([place]) 01/12/2019      OB History   No obstetric history on file.     No family history on file.  Social History   Tobacco Use  . Smoking status: Not on file  Substance Use Topics  . Alcohol use: Not on file  . Drug use: Not on file    Home Medications Prior to Admission medications   Not on File    Allergies    Patient has no allergy information on record.  Review of Systems   Review of Systems  Unable to perform ROS: Mental status change    Physical Exam Updated Vital Signs BP 119/80   Pulse 88   Temp 98.1 F (36.7 C) (Axillary)   Resp (!) 23   LMP  (LMP Unknown)   SpO2 99%   Physical Exam Vitals and nursing note reviewed.  Constitutional:    General: She is not in acute distress.    Appearance: She is well-developed.  HENT:     Head: Normocephalic and atraumatic.  Eyes:     General:        Right eye: No discharge.        Left eye: No discharge.     Extraocular Movements: Extraocular movements intact.     Conjunctiva/sclera: Conjunctivae normal.     Pupils: Pupils are equal, round, and reactive to light.  Neck:     Vascular: No JVD.     Trachea: No tracheal deviation.  Cardiovascular:     Rate and Rhythm: Regular rhythm. Tachycardia present.  Pulmonary:     Effort: Pulmonary effort is normal.     Breath sounds: Normal breath sounds.     Comments: Some transmitted upper airway sounds, O2 saturations down to 84% on RA when she is her most somnolent, improves after administration of narcan Abdominal:     General: Bowel sounds are normal. There is no distension.     Palpations: Abdomen is soft.     Tenderness: There is no abdominal tenderness.  Musculoskeletal:     Cervical back: Normal range of motion and neck supple.  Skin:    General: Skin is warm and dry.     Findings: Erythema present.  Comments: 3x3cm abscess to dorsum of left hand, multiple skin lesions consistent with track marks to all extremities  Neurological:     Mental Status: She is alert.     GCS: GCS eye subscore is 2. GCS verbal subscore is 3. GCS motor subscore is 5.     Comments: Moves all extremities spontaneously without difficulty.  She is drowsy, more arousable after Narcan.  Does not answer questions or follow commands.  Psychiatric:        Behavior: Behavior normal.     ED Results / Procedures / Treatments   Labs (all labs ordered are listed, but only abnormal results are displayed) Labs Reviewed  CBC WITH DIFFERENTIAL/PLATELET - Abnormal; Notable for the following components:      Result Value   Hemoglobin 10.8 (*)    HCT 35.0 (*)    MCV 77.4 (*)    MCH 23.9 (*)    All other components within normal limits  SALICYLATE LEVEL -  Abnormal; Notable for the following components:   Salicylate Lvl [id number] (*)    All other components within normal limits  ACETAMINOPHEN LEVEL - Abnormal; Notable for the following components:   Acetaminophen (Tylenol), Serum <10 (*)    All other components within normal limits  RAPID URINE DRUG SCREEN, HOSP PERFORMED - Abnormal; Notable for the following components:   Opiates POSITIVE (*)    Cocaine POSITIVE (*)    Benzodiazepines POSITIVE (*)    Amphetamines POSITIVE (*)    All other components within normal limits  SARS CORONAVIRUS 2 (TAT 6-24 HRS)  COMPREHENSIVE METABOLIC PANEL  ETHANOL  I-STAT BETA HCG BLOOD, ED (MC, WL, AP ONLY)    EKG EKG Interpretation  Date/Time:  Saturday January 12 2019 12:46:50 EST Ventricular Rate:  103 PR Interval:    QRS Duration: 77 QT Interval:  347 QTC Calculation: 455 R Axis:   76 Text Interpretation: Sinus tachycardia Consider left atrial enlargement No STEMI Confirmed by Nanda Quinton [phone]) on 01/12/2019 12:51:32 PM   Radiology DG Chest Portable 1 View  Result Date: 01/12/2019 CLINICAL DATA:  Substance abuse. EXAM: PORTABLE CHEST 1 VIEW COMPARISON:  None. FINDINGS: The heart size and mediastinal contours are within normal limits for AP technique. Low volumes but otherwise lungs are clear. No pneumothorax or large pleural effusion. No acute finding in the visualized skeleton. IMPRESSION: No acute cardiopulmonary finding. Electronically Signed   By: Audie Pinto M.D.   On: 01/12/2019 13:21    Procedures .Critical Care Performed by: Renita Papa, PA-C Authorized by: Renita Papa, PA-C   Critical care provider statement:    Critical care time (minutes):  45   Critical care was necessary to treat or prevent imminent or life-threatening deterioration of the following conditions:  Toxidrome and CNS failure or compromise   Critical care was time spent personally by me on the following activities:  Discussions with consultants, evaluation of  patient's response to treatment, examination of patient, ordering and performing treatments and interventions, ordering and review of laboratory studies, ordering and review of radiographic studies, pulse oximetry, re-evaluation of patient's condition, obtaining history from patient or surrogate and review of old charts   (including critical care time)  Medications Ordered in ED Medications  naloxone HCl (NARCAN) 4 mg in dextrose 5 % 250 mL infusion (0 mg/hr Intravenous Stopped 01/12/19 1513)  naloxone (NARCAN) 2 MG/2ML injection (2 mg  Given 01/12/19 1245)  sodium chloride 0.9 % bolus 1,000 mL (0 mLs Intravenous Stopped 01/12/19 1354)  ED Course  I have reviewed the triage vital signs and the nursing notes.  Pertinent labs & imaging results that were available during my care of the patient were reviewed by me and considered in my medical decision making (see chart for details).    MDM Rules/Calculators/A&P                      Patient presenting brought in by EMS for evaluation of likely drug overdose.  She is afebrile, tachycardic.  Hypoxic when she is most somnolent but improves with administration of Narcan.  She is quite drowsy but arouses with Narcan temporarily.  She received in total 6 mg of Narcan both intranasally and 2 doses of 1 mg Narcan IV with no persistent provement in mental status so the decision was made to start her on a Narcan drip.  She is not answering questions or following commands so history is limited.  Evidence of IV drug use on physical examination.  EKG shows sinus tachycardia no acute ischemic abnormalities.  Chest x-ray shows no evidence of aspiration pneumonia.  Lab work reviewed by me shows no leukocytosis, mild anemia, no renal insufficiency, no electrolyte abnormalities.  Her UDS is positive for opiates, cocaine, benzodiazepines, and amphetamines.  On reevaluation on Narcan drip she is sleepy but easily arousable to painful stimuli.  Again will not follow commands  or answer questions.  She is protecting her airway.  O2 sats are stable on room air.  She will require admission for further evaluation and management of persistently altered mental status likely in the setting of heroin overdose.  Spoke with Dr. Marylyn Ishihara with [facility] who agrees to assume care of patient and bring her into the hospital for further evaluation and management.  Patient seen and evaluated by Dr. Laverta Baltimore who agrees with assessment and plan at this time.   Final Clinical Impression(s) / ED Diagnoses Final diagnoses:  Drug overdose, undetermined intent, initial encounter    Rx / DC Orders ED Discharge Orders    None       Debroah Baller 01/12/19 1607    Margette Fast, MD 01/12/19 2029

## 2019-01-12 NOTE — ED Notes (Signed)
Patient still unable to give give medical information. Triage not completed.

## 2019-01-13 ENCOUNTER — Encounter (HOSPITAL_COMMUNITY): Payer: Self-pay | Admitting: Internal Medicine

## 2019-01-13 DIAGNOSIS — R400 Somnolence: Secondary | ICD-10-CM

## 2019-01-13 LAB — COMPREHENSIVE METABOLIC PANEL
ALT: 31 U/L (ref 0–44)
AST: 34 U/L (ref 15–41)
Albumin: 3.2 g/dL — ABNORMAL LOW (ref 3.5–5.0)
Alkaline Phosphatase: 51 U/L (ref 38–126)
Anion gap: 7 (ref 5–15)
BUN: 12 mg/dL (ref 6–20)
CO2: 21 mmol/L — ABNORMAL LOW (ref 22–32)
Calcium: 8.6 mg/dL — ABNORMAL LOW (ref 8.9–10.3)
Chloride: 110 mmol/L (ref 98–111)
Creatinine, Ser: 0.59 mg/dL (ref 0.44–1.00)
GFR calc Af Amer: >60 mL/min (ref >60)
GFR calc non Af Amer: >60 mL/min (ref >60)
Glucose, Bld: 79 mg/dL (ref 70–99)
Potassium: 3.7 mmol/L (ref 3.5–5.1)
Sodium: 138 mmol/L (ref 135–145)
Total Bilirubin: 0.7 mg/dL (ref 0.3–1.2)
Total Protein: 6.3 g/dL — ABNORMAL LOW (ref 6.5–8.1)

## 2019-01-13 LAB — CBC
HCT: 33.4 % — ABNORMAL LOW (ref 36.0–46.0)
Hemoglobin: 10.4 g/dL — ABNORMAL LOW (ref 12.0–15.0)
MCH: 23.8 pg — ABNORMAL LOW (ref 26.0–34.0)
MCHC: 31.1 g/dL (ref 30.0–36.0)
MCV: 76.4 fL — ABNORMAL LOW (ref 80.0–100.0)
Platelets: 198 10*3/uL (ref 150–400)
RBC: 4.37 MIL/uL (ref 3.87–5.11)
RDW: 14.8 % (ref 11.5–15.5)
WBC: 4.3 10*3/uL (ref 4.0–10.5)
nRBC: 0 % (ref 0.0–0.2)

## 2019-01-13 LAB — PROTIME-INR
INR: 1.1 (ref 0.8–1.2)
Prothrombin Time: 14.3 seconds (ref 11.4–15.2)

## 2019-01-13 LAB — HIV ANTIBODY (ROUTINE TESTING W REFLEX): HIV Screen 4th Generation wRfx: NONREACTIVE

## 2019-01-13 MED ORDER — INFLUENZA VAC SPLIT QUAD 0.5 ML IM SUSY: 0.5000 mL | PREFILLED_SYRINGE | INTRAMUSCULAR | Status: DC

## 2019-01-13 MED ORDER — SULFAMETHOXAZOLE-TRIMETHOPRIM 400-80 MG PO TABS
1.0000 | ORAL_TABLET | Freq: Two times a day (BID) | ORAL | Status: DC
  Administered 2019-01-13: 1 via ORAL
  Filled 2019-01-13 (×2): qty 1

## 2019-01-13 MED ORDER — ALPRAZOLAM 0.25 MG PO TABS: 0.2500 mg | ORAL_TABLET | Freq: Three times a day (TID) | ORAL | Status: DC | PRN

## 2019-01-13 NOTE — Progress Notes (Signed)
Per attending physician, patient can leave AMA. Will provide AMA documentation for patient signature.

## 2019-01-13 NOTE — Progress Notes (Signed)
Patient signed AMA documentation. IV access discontinued. Patient encouraged to wait 20 minutes to be monitored for any complications. Patient verbally agreed.

## 2019-01-13 NOTE — Progress Notes (Signed)
Patient states she is leaving AMA. Attending physician notified.

## 2019-01-13 NOTE — Progress Notes (Signed)
PROGRESS NOTE    Cindy Watts  [id number] DOB: [birth year] DOA: 01/12/2019 PCP: Patient, No Pcp Per   Brief Narrative:  Patient is a 29 year old female with history of IV drug  /polysubstance abuse who was brought to the emergency department after she was found passed out in a restaurant parking lot.  She was seen by EMS and was given Narcan.  Started on Narcan gtt. that resulted in improvement in the mental status.  She denies any suicidal ideation or admits of current IV drug use.  This morning she was hemodynamically stable.  Assessment & Plan:   Active Problems:   Drug overdose   AMS (altered mental status)   Microcytic anemia   Polysubstance abuse (HCC)   Altered mental status: Due to drug overdose.  Her UDS was positive for cocaine, amphetamine, benzodiazepine, opiates.  Injects heroin. Denies any suicidal ideation and said the drug overdose was recreational purpose.  No need of sitter. This morning she is alert and oriented.  Substance abuse: Injects heroin.  She had a small abscess on the left wrist area.  Will check blood cultures.  She is afebrile.  Started on Bactrim. She says she has resources to follow-up for alcohol rehabilitation.  I have requested for social worker evaluation.  Microcytic anemia: Iron studies showed normal iron level.  Continue to monitor as an outpatient.  Hepatitis C: Untreated.  Follow-up with infectious disease recommended as an outpatient for consideration of treatment  Anxiety: Continue Xanax as needed           DVT prophylaxis: SCD Code Status: Full Family Communication: None present at the bedside Disposition Plan: Home tomorrow   Consultants: None  Procedures: None  Antimicrobials:  Anti-infectives (From admission, onward)   Start     Dose/Rate Route Frequency Ordered Stop   01/13/19 1000  sulfamethoxazole-trimethoprim (BACTRIM) 400-80 MG per tablet 1 tablet     1 tablet Oral Every 12 hours 01/13/19 0846         Subjective:  Patient seen and examined at the bedside this morning.  Hemodynamically stable.  Alert and oriented.  Hungry and wants to eat.  Denies any complaints.  Objective: Vitals:   01/13/19 0700 01/13/19 0800 01/13/19 0900 01/13/19 1000  BP: 113/77 (!) 144/90 118/71 106/61  Pulse: 65 84 (!) 46 90  Resp: 20 (!) 21 15 (!) 22  Temp:  98.4 F (36.9 C)    TempSrc:  Oral    SpO2: 100% 100% 100% 100%  Weight:      Height:        Intake/Output Summary (Last 24 hours) at 01/13/2019 1201 Last data filed at 01/13/2019 1000 Gross per 24 hour  Intake 2208.17 ml  Output 1200 ml  Net 1008.17 ml   Filed Weights   01/12/19 2038  Weight: 53.1 kg    Examination:  General exam: Disheveled, thin built HEENT:PERRL,Oral mucosa moist, Ear/Nose normal on gross exam Respiratory system: Bilateral equal air entry, normal vesicular breath sounds, no wheezes or crackles  Cardiovascular system: S1 & S2 heard, RRR. No JVD, murmurs, rubs, gallops or clicks. No pedal edema. Gastrointestinal system: Abdomen is nondistended, soft and nontender. No organomegaly or masses felt. Normal bowel sounds heard. Central nervous system: Alert and oriented. No focal neurological deficits. Extremities: No edema, no clubbing ,no cyanosis, distal peripheral pulses palpable. Skin: Erythematous, fluctuant area on the ventral surface of the left wrist, tattoos    Data Reviewed: I have personally reviewed following labs and imaging studies  CBC: Recent Labs  Lab 01/12/19 1255 01/13/19 0321  WBC 8.6 4.3  NEUTROABS 5.6  --   HGB 10.8* 10.4*  HCT 35.0* 33.4*  MCV 77.4* 76.4*  PLT 303 [id number]   Basic Metabolic Panel: Recent Labs  Lab 01/12/19 1255 01/12/19 2130 01/13/19 0321  NA 138  --  138  K 3.8  --  3.7  CL 104  --  110  CO2 25  --  21*  GLUCOSE 76  --  79  BUN 16  --  12  CREATININE 0.66  --  0.59  CALCIUM 9.4  --  8.6*  MG  --  2.1  --   PHOS  --  3.8  --    GFR: Estimated Creatinine  Clearance: 81.3 mL/min (by C-G formula based on SCr of 0.59 mg/dL). Liver Function Tests: Recent Labs  Lab 01/12/19 1255 01/13/19 0321  AST 31 34  ALT 33 31  ALKPHOS 56 51  BILITOT 0.6 0.7  PROT 7.4 6.3*  ALBUMIN 4.0 3.2*   No results for input(s): LIPASE, AMYLASE in the last 168 hours. No results for input(s): AMMONIA in the last 168 hours. Coagulation Profile: Recent Labs  Lab 01/13/19 0321  INR 1.1   Cardiac Enzymes: No results for input(s): CKTOTAL, CKMB, CKMBINDEX, TROPONINI in the last 168 hours. BNP (last 3 results) No results for input(s): PROBNP in the last 8760 hours. HbA1C: No results for input(s): HGBA1C in the last 72 hours. CBG: No results for input(s): GLUCAP in the last 168 hours. Lipid Profile: No results for input(s): CHOL, HDL, LDLCALC, TRIG, CHOLHDL, LDLDIRECT in the last 72 hours. Thyroid Function Tests: Recent Labs    01/12/19 2130  TSH 0.374   Anemia Panel: Recent Labs    01/12/19 2130  VITAMINB12 446  TIBC 397  IRON 45   Sepsis Labs: No results for input(s): PROCALCITON, LATICACIDVEN in the last 168 hours.  Recent Results (from the past 240 hour(s))  SARS CORONAVIRUS 2 (TAT 6-24 HRS) Nasopharyngeal Nasopharyngeal Swab     Status: None   Collection Time: 01/12/19  3:26 PM   Specimen: Nasopharyngeal Swab  Result Value Ref Range Status   SARS Coronavirus 2 NEGATIVE NEGATIVE Final    Comment: (NOTE) SARS-CoV-2 target nucleic acids are NOT DETECTED. The SARS-CoV-2 RNA is generally detectable in upper and lower respiratory specimens during the acute phase of infection. Negative results do not preclude SARS-CoV-2 infection, do not rule out co-infections with other pathogens, and should not be used as the sole basis for treatment or other patient management decisions. Negative results must be combined with clinical observations, patient history, and epidemiological information. The expected result is Negative. Fact Sheet for  Patients: SugarRoll.be Fact Sheet for Healthcare Providers: https://www.woods-mathews.com/ This test is not yet approved or cleared by the [place] FDA and  has been authorized for detection and/or diagnosis of SARS-CoV-2 by FDA under an Emergency Use Authorization (EUA). This EUA will remain  in effect (meaning this test can be used) for the duration of the COVID-19 declaration under Section 56 4(b)(1) of the Act, 21 U.S.C. section 360bbb-3(b)(1), unless the authorization is terminated or revoked sooner. Performed at [facility], [place] [address] [place], [place] [address]          Radiology Studies: CT HEAD WO CONTRAST  Result Date: 01/12/2019 CLINICAL DATA:  Unresponsive, encephalopathy, overdose EXAM: CT HEAD WITHOUT CONTRAST TECHNIQUE: Contiguous axial images were obtained from the base of the skull through the vertex  without intravenous contrast. COMPARISON:  None. FINDINGS: Brain: No evidence of acute infarction, hemorrhage, hydrocephalus, extra-axial collection or mass lesion/mass effect. Vascular: No hyperdense vessel or unexpected calcification. Skull: Normal. Negative for fracture or focal lesion. Sinuses/Orbits: No acute finding. Other: None. IMPRESSION: No acute intracranial pathology. Electronically Signed   By: Eddie Candle M.D.   On: 01/12/2019 19:11   DG Chest Portable 1 View  Result Date: 01/12/2019 CLINICAL DATA:  Substance abuse. EXAM: PORTABLE CHEST 1 VIEW COMPARISON:  None. FINDINGS: The heart size and mediastinal contours are within normal limits for AP technique. Low volumes but otherwise lungs are clear. No pneumothorax or large pleural effusion. No acute finding in the visualized skeleton. IMPRESSION: No acute cardiopulmonary finding. Electronically Signed   By: Audie Pinto M.D.   On: 01/12/2019 13:21        Scheduled Meds: . Chlorhexidine Gluconate Cloth  6 each Topical Daily  .  sulfamethoxazole-trimethoprim  1 tablet Oral Q12H   Continuous Infusions:   LOS: 1 day    Time spent: 25 mins.More than 50% of that time was spent in counseling and/or coordination of care.      Shelly Coss, MD [facility] Pager [phone]  If 7PM-7AM, please contact night-coverage [email or web address] Password [email or web address] 01/13/2019, 12:01 PM

## 2019-01-14 ENCOUNTER — Encounter: Payer: Self-pay | Admitting: Obstetrics and Gynecology

## 2019-01-16 NOTE — Discharge Summary (Signed)
Physician Discharge Summary  Cindy Watts [id number] DOB: Jul 26, 1990 DOA: 01/12/2019  PCP: Patient, No Pcp Per  Admit date: 01/12/2019 Discharge date: 01/16/2019  Admitted From: Home Disposition:  Signed AMA   Brief/Interim Summary: Patient signed AMA. Please see my progress note on 01/13/19 for details.  Discharge Diagnoses:  Active Problems:   Drug overdose   AMS (altered mental status)   Microcytic anemia   Polysubstance abuse ([place])    Discharge Instructions   Allergies as of 01/13/2019   No Known Allergies     Medication List    You have not been prescribed any medications.     No Known Allergies  Consultations:  None   Procedures/Studies: CT HEAD WO CONTRAST  Result Date: 01/12/2019 CLINICAL DATA:  Unresponsive, encephalopathy, overdose EXAM: CT HEAD WITHOUT CONTRAST TECHNIQUE: Contiguous axial images were obtained from the base of the skull through the vertex without intravenous contrast. COMPARISON:  None. FINDINGS: Brain: No evidence of acute infarction, hemorrhage, hydrocephalus, extra-axial collection or mass lesion/mass effect. Vascular: No hyperdense vessel or unexpected calcification. Skull: Normal. Negative for fracture or focal lesion. Sinuses/Orbits: No acute finding. Other: None. IMPRESSION: No acute intracranial pathology. Electronically Signed   By: Eddie Candle M.D.   On: 01/12/2019 19:11   DG Chest Portable 1 View  Result Date: 01/12/2019 CLINICAL DATA:  Substance abuse. EXAM: PORTABLE CHEST 1 VIEW COMPARISON:  None. FINDINGS: The heart size and mediastinal contours are within normal limits for AP technique. Low volumes but otherwise lungs are clear. No pneumothorax or large pleural effusion. No acute finding in the visualized skeleton. IMPRESSION: No acute cardiopulmonary finding. Electronically Signed   By: Audie Pinto M.D.   On: 01/12/2019 13:21       Subjective:   Discharge Exam: Vitals:   01/13/19 1201 01/13/19 1319  BP:   103/68  Pulse:  78  Resp:  20  Temp: 98.4 F (36.9 C) 98.4 F (36.9 C)  SpO2:  100%   Vitals:   01/13/19 1100 01/13/19 1200 01/13/19 1201 01/13/19 1319  BP: 101/69 (!) 97/53  103/68  Pulse: 84 83  78  Resp: (!) 23 19  20   Temp:   98.4 F (36.9 C) 98.4 F (36.9 C)  TempSrc:   Oral Oral  SpO2: 100% 100%  100%  Weight:      Height:          The results of significant diagnostics from this hospitalization (including imaging, microbiology, ancillary and laboratory) are listed below for reference.     Microbiology: Recent Results (from the past 240 hour(s))  SARS CORONAVIRUS 2 (TAT 6-24 HRS) Nasopharyngeal Nasopharyngeal Swab     Status: None   Collection Time: 01/12/19  3:26 PM   Specimen: Nasopharyngeal Swab  Result Value Ref Range Status   SARS Coronavirus 2 NEGATIVE NEGATIVE Final    Comment: (NOTE) SARS-CoV-2 target nucleic acids are NOT DETECTED. The SARS-CoV-2 RNA is generally detectable in upper and lower respiratory specimens during the acute phase of infection. Negative results do not preclude SARS-CoV-2 infection, do not rule out co-infections with other pathogens, and should not be used as the sole basis for treatment or other patient management decisions. Negative results must be combined with clinical observations, patient history, and epidemiological information. The expected result is Negative. Fact Sheet for Patients: SugarRoll.be Fact Sheet for Healthcare Providers: https://www.woods-mathews.com/ This test is not yet approved or cleared by the [place] FDA and  has been authorized for detection and/or diagnosis of SARS-CoV-2 by  FDA under an Emergency Use Authorization (EUA). This EUA will remain  in effect (meaning this test can be used) for the duration of the COVID-19 declaration under Section 56 4(b)(1) of the Act, 21 U.S.C. section 360bbb-3(b)(1), unless the authorization is terminated or revoked  sooner. Performed at [facility], [place] [address], [place], [place] [address]   Culture, blood (routine x 2)     Status: None (Preliminary result)   Collection Time: 01/13/19  9:54 AM   Specimen: BLOOD RIGHT HAND  Result Value Ref Range Status   Specimen Description   Final    BLOOD RIGHT HAND Performed at [facility] [address] [place], [place] [address]    Special Requests   Final    BOTTLES DRAWN AEROBIC AND ANAEROBIC Blood Culture adequate volume Performed at [facility] [address], [place], [place] [address]    Culture   Final    NO GROWTH 3 DAYS Performed at [facility], [place] [address] [place], [place] [address]    Report Status PENDING  Incomplete  Culture, blood (routine x 2)     Status: None (Preliminary result)   Collection Time: 01/13/19  9:54 AM   Specimen: BLOOD  Result Value Ref Range Status   Specimen Description   Final    BLOOD RIGHT ARM Performed at [facility] [address] [place], [place] [address]    Special Requests   Final    BOTTLES DRAWN AEROBIC ONLY Blood Culture results may not be optimal due to an inadequate volume of blood received in culture bottles Performed at [facility] [address], [place], [place] [address]    Culture   Final    NO GROWTH 3 DAYS Performed at [facility], [place] [address] [place],  [address]    Report Status PENDING  Incomplete     Labs: BNP (last 3 results) No results for input(s): BNP in the last 8760 hours. Basic Metabolic Panel: Recent Labs  Lab 01/12/19 1255 01/12/19 2130 01/13/19 0321  NA 138  --  138  K 3.8  --  3.7  CL 104  --  110  CO2 25  --  21*  GLUCOSE 76  --  79  BUN 16  --  12  CREATININE 0.66  --  0.59  CALCIUM 9.4  --  8.6*  MG  --  2.1  --   PHOS  --  3.8  --    Liver Function Tests: Recent Labs  Lab 01/12/19 1255 01/13/19 0321  AST 31 34  ALT 33 31  ALKPHOS  56 51  BILITOT 0.6 0.7  PROT 7.4 6.3*  ALBUMIN 4.0 3.2*   No results for input(s): LIPASE, AMYLASE in the last 168 hours. No results for input(s): AMMONIA in the last 168 hours. CBC: Recent Labs  Lab 01/12/19 1255 01/13/19 0321  WBC 8.6 4.3  NEUTROABS 5.6  --   HGB 10.8* 10.4*  HCT 35.0* 33.4*  MCV 77.4* 76.4*  PLT 303 198   Cardiac Enzymes: No results for input(s): CKTOTAL, CKMB, CKMBINDEX, TROPONINI in the last 168 hours. BNP: Invalid input(s): POCBNP CBG: No results for input(s): GLUCAP in the last 168 hours. D-Dimer No results for input(s): DDIMER in the last 72 hours. Hgb A1c No results for input(s): HGBA1C in the last 72 hours. Lipid Profile No results for input(s): CHOL, HDL, LDLCALC, TRIG, CHOLHDL, LDLDIRECT in the last  72 hours. Thyroid function studies No results for input(s): TSH, T4TOTAL, T3FREE, THYROIDAB in the last 72 hours.  Invalid input(s): FREET3 Anemia work up No results for input(s): VITAMINB12, FOLATE, FERRITIN, TIBC, IRON, RETICCTPCT in the last 72 hours. Urinalysis    Component Value Date/Time   COLORURINE YELLOW 09/01/2018 1456   APPEARANCEUR CLOUDY (A) 09/01/2018 1456   LABSPEC 1.014 09/01/2018 1456   PHURINE 7.0 09/01/2018 1456   GLUCOSEU NEGATIVE 09/01/2018 1456   HGBUR NEGATIVE 09/01/2018 1456   BILIRUBINUR NEGATIVE 09/01/2018 1456   KETONESUR NEGATIVE 09/01/2018 1456   PROTEINUR NEGATIVE 09/01/2018 1456   NITRITE NEGATIVE 09/01/2018 1456   LEUKOCYTESUR TRACE (A) 09/01/2018 1456   Sepsis Labs Invalid input(s): PROCALCITONIN,  WBC,  LACTICIDVEN Microbiology Recent Results (from the past 240 hour(s))  SARS CORONAVIRUS 2 (TAT 6-24 HRS) Nasopharyngeal Nasopharyngeal Swab     Status: None   Collection Time: 01/12/19  3:26 PM   Specimen: Nasopharyngeal Swab  Result Value Ref Range Status   SARS Coronavirus 2 NEGATIVE NEGATIVE Final    Comment: (NOTE) SARS-CoV-2 target nucleic acids are NOT DETECTED. The SARS-CoV-2 RNA is generally  detectable in upper and lower respiratory specimens during the acute phase of infection. Negative results do not preclude SARS-CoV-2 infection, do not rule out co-infections with other pathogens, and should not be used as the sole basis for treatment or other patient management decisions. Negative results must be combined with clinical observations, patient history, and epidemiological information. The expected result is Negative. Fact Sheet for Patients: SugarRoll.be Fact Sheet for Healthcare Providers: https://www.woods-mathews.com/ This test is not yet approved or cleared by the [place] FDA and  has been authorized for detection and/or diagnosis of SARS-CoV-2 by FDA under an Emergency Use Authorization (EUA). This EUA will remain  in effect (meaning this test can be used) for the duration of the COVID-19 declaration under Section 56 4(b)(1) of the Act, 21 U.S.C. section 360bbb-3(b)(1), unless the authorization is terminated or revoked sooner. Performed at [facility], [place] [address] [place], [place] [address]   Culture, blood (routine x 2)     Status: None (Preliminary result)   Collection Time: 01/13/19  9:54 AM   Specimen: BLOOD RIGHT HAND  Result Value Ref Range Status   Specimen Description   Final    BLOOD RIGHT HAND Performed at [facility] [address] [place], [place] [address]    Special Requests   Final    BOTTLES DRAWN AEROBIC AND ANAEROBIC Blood Culture adequate volume Performed at [facility] [address], [place], [place] [address]    Culture   Final    NO GROWTH 3 DAYS Performed at [facility], [place] [address], [place], [place] [address]    Report Status PENDING  Incomplete  Culture, blood (routine x 2)     Status: None (Preliminary result)   Collection Time: 01/13/19  9:54 AM   Specimen: BLOOD  Result Value Ref Range Status   Specimen Description    Final    BLOOD RIGHT ARM Performed at [facility] [address] [place], [place] [address]    Special Requests   Final    BOTTLES DRAWN AEROBIC ONLY Blood Culture results may not be optimal due to an inadequate volume of blood received in culture bottles Performed at [facility] [address] [place], [place] [address]    Culture   Final    NO GROWTH 3 DAYS  Performed at [facility], [place] [address] [place], [place] [address]    Report Status PENDING  Incomplete    Please note: You were cared for by a hospitalist during your hospital stay. Once you are discharged, your primary care physician will handle any further medical issues. Please note that NO REFILLS for any discharge medications will be authorized once you are discharged, as it is imperative that you return to your primary care physician (or establish a relationship with a primary care physician if you do not have one) for your post hospital discharge needs so that they can reassess your need for medications and monitor your lab values.    Time coordinating discharge: 40 minutes  SIGNED:   Shelly Coss, MD  [facility] 01/16/2019, 5:45 PM Pager [phone]  If 7PM-7AM, please contact night-coverage [email or web address] Password [email or web address]

## 2019-01-18 ENCOUNTER — Encounter (INDEPENDENT_AMBULATORY_CARE_PROVIDER_SITE_OTHER): Payer: Self-pay

## 2019-01-18 LAB — CULTURE, BLOOD (ROUTINE X 2)
Culture: NO GROWTH
Culture: NO GROWTH
Special Requests: ADEQUATE

## 2019-05-27 ENCOUNTER — Other Ambulatory Visit: Payer: Self-pay | Admitting: Certified Nurse Midwife

## 2019-05-27 DIAGNOSIS — N898 Other specified noninflammatory disorders of vagina: Secondary | ICD-10-CM

## 2019-05-27 DIAGNOSIS — O26892 Other specified pregnancy related conditions, second trimester: Secondary | ICD-10-CM

## 2019-07-23 ENCOUNTER — Other Ambulatory Visit: Payer: Self-pay

## 2019-07-23 ENCOUNTER — Emergency Department (HOSPITAL_COMMUNITY): Payer: Medicaid Other

## 2019-07-23 ENCOUNTER — Emergency Department (HOSPITAL_COMMUNITY)
Admission: EM | Admit: 2019-07-23 | Discharge: 2019-07-24 | Disposition: A | Discharge status: Home / Self Care | Payer: Medicaid Other | Attending: Emergency Medicine | Admitting: Emergency Medicine

## 2019-07-23 ENCOUNTER — Encounter (HOSPITAL_COMMUNITY): Payer: Self-pay

## 2019-07-23 DIAGNOSIS — Z5321 Procedure and treatment not carried out due to patient leaving prior to being seen by health care provider: Secondary | ICD-10-CM | POA: Diagnosis not present

## 2019-07-23 DIAGNOSIS — R0789 Other chest pain: Secondary | ICD-10-CM | POA: Diagnosis not present

## 2019-07-23 NOTE — ED Triage Notes (Signed)
Pt reports severe left sided chest pain that started 2 weeks ago but worsened last night. No other symptoms. Pt refused blood draw in triage stating "Im an IV drug user, you wont be able to get it"

## 2019-07-23 NOTE — ED Notes (Signed)
Pt did not answer for vitals recheck 

## 2019-07-25 ENCOUNTER — Other Ambulatory Visit: Payer: Self-pay

## 2019-07-25 ENCOUNTER — Encounter (HOSPITAL_COMMUNITY): Payer: Self-pay | Admitting: *Deleted

## 2019-07-25 ENCOUNTER — Emergency Department (HOSPITAL_COMMUNITY)
Admission: EM | Admit: 2019-07-25 | Discharge: 2019-07-25 | Disposition: A | Discharge status: Home / Self Care | Payer: Medicaid Other | Attending: Emergency Medicine | Admitting: Emergency Medicine

## 2019-07-25 DIAGNOSIS — Z5321 Procedure and treatment not carried out due to patient leaving prior to being seen by health care provider: Secondary | ICD-10-CM | POA: Insufficient documentation

## 2019-07-25 DIAGNOSIS — N644 Mastodynia: Secondary | ICD-10-CM | POA: Diagnosis not present

## 2019-07-25 NOTE — ED Notes (Signed)
Pt called for vitals no answer Pt stickers left in chair and Pt not in lobby.

## 2019-07-25 NOTE — ED Triage Notes (Signed)
[place] the ppt has been having lt breast pain for 48 hours  She ws in this ed yesterday and she reports that she left before she was seen she is an iv drug user  She last did heroin  Several hours ago  She refused to have lab drawn last pm

## 2020-01-13 IMAGING — US US MFM OB FOLLOW UP
1 series · 13 of 28 positions shown · non-contrast
Comparison: none

[Series 1: us mfm ob follow up · 84 acquisitions, 13 frames shown]
[im 4/84]
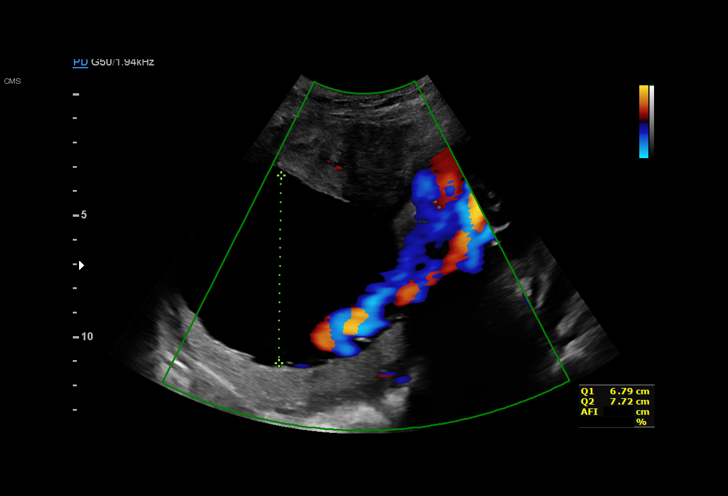
[im 10/84]
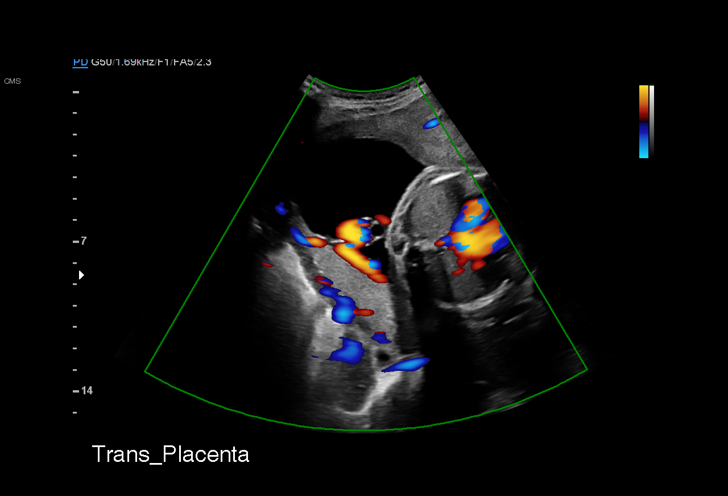
[im 16/84]
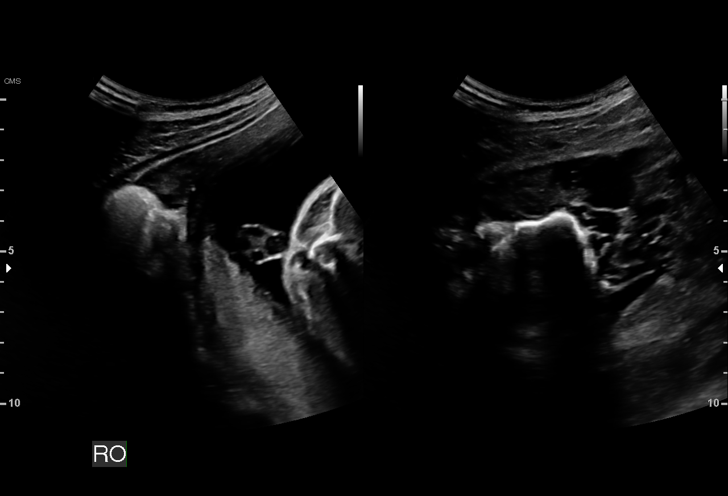
[im 22/84]
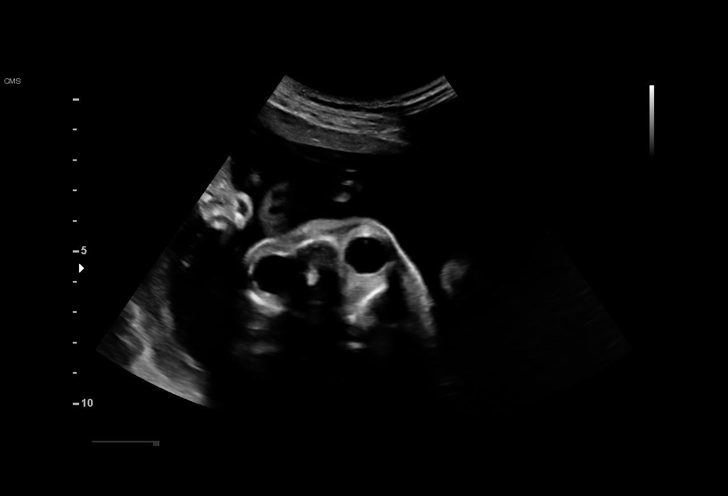
[im 28/84]
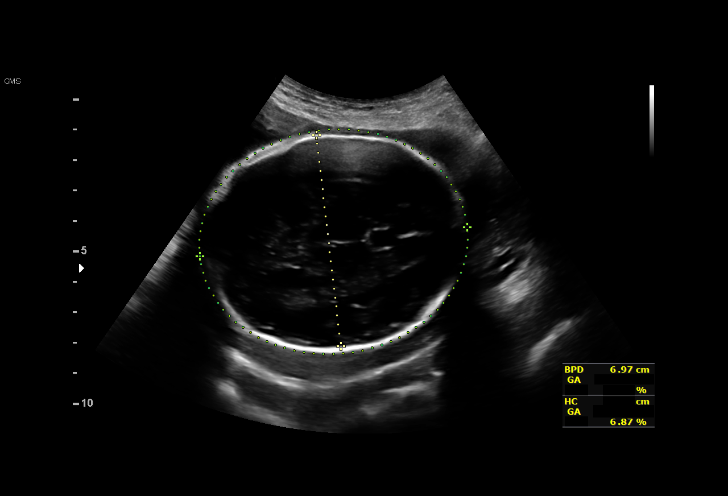
[im 34/84]
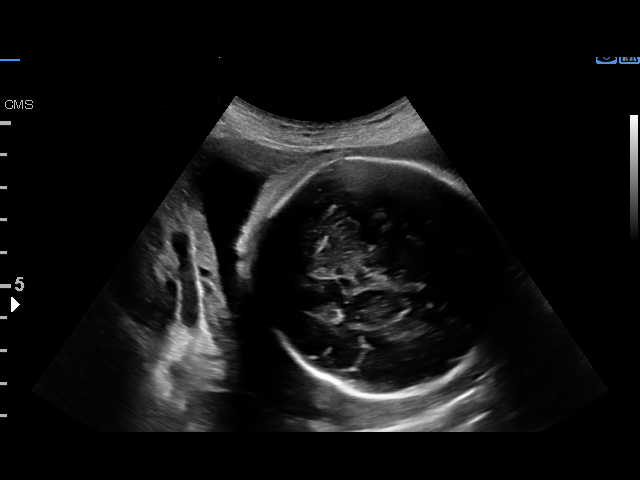
[im 44/84]
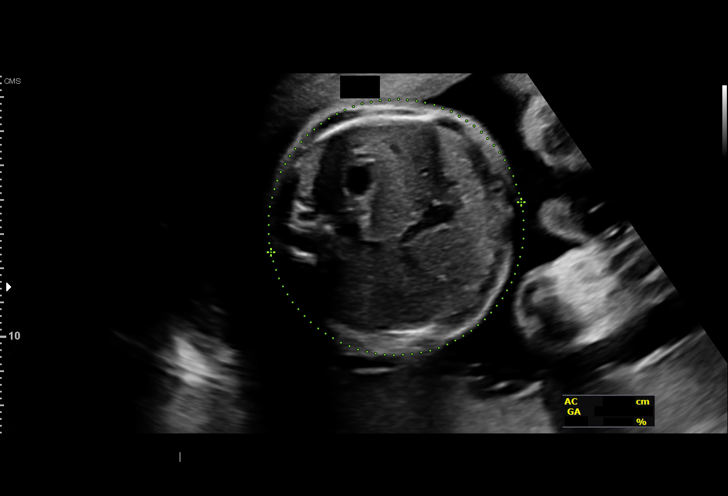
[im 50/84]
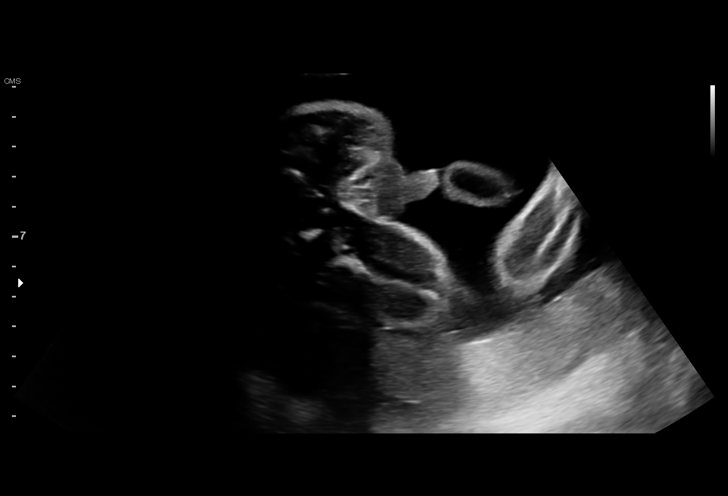
[im 56/84]
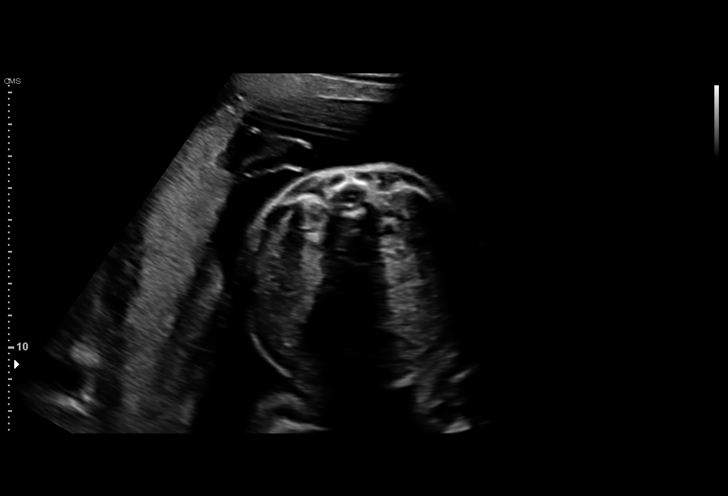
[im 62/84]
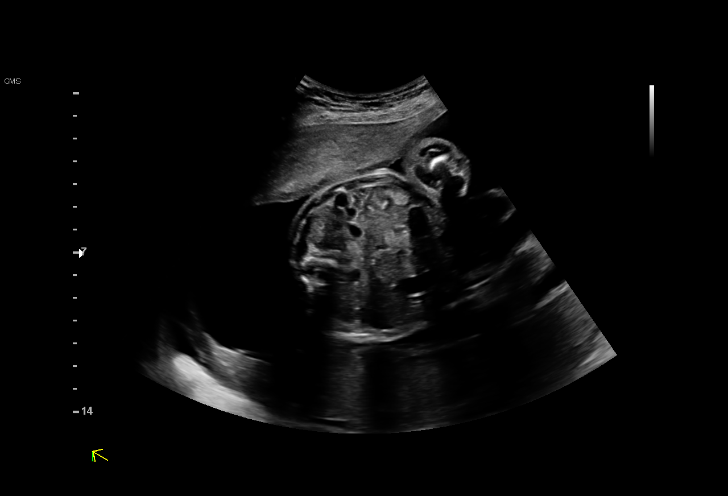
[im 68/84]
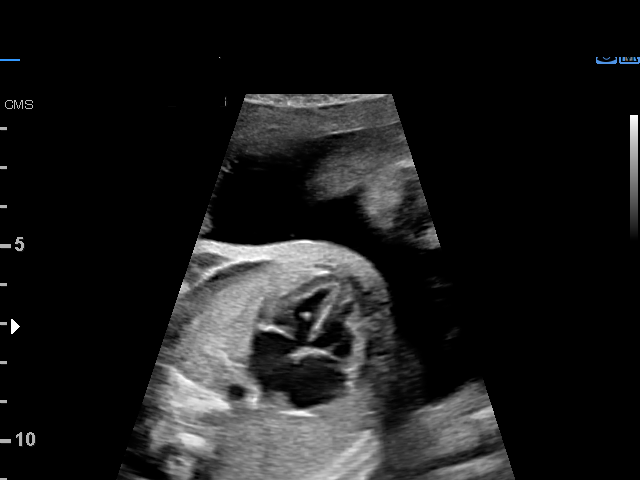
[im 74/84]
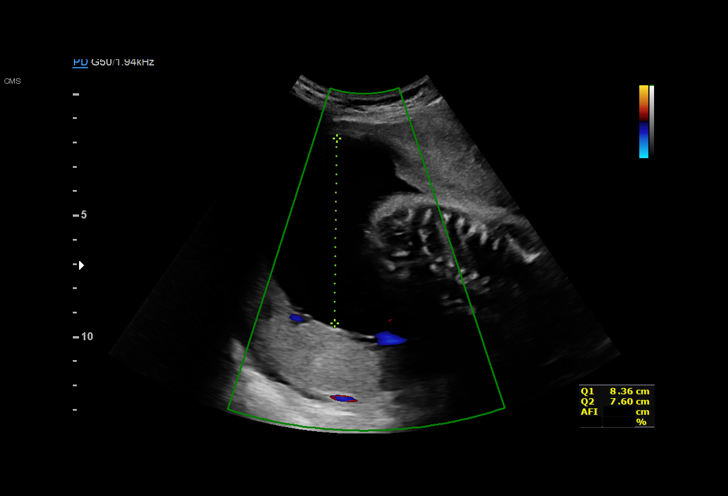
[im 80/84]
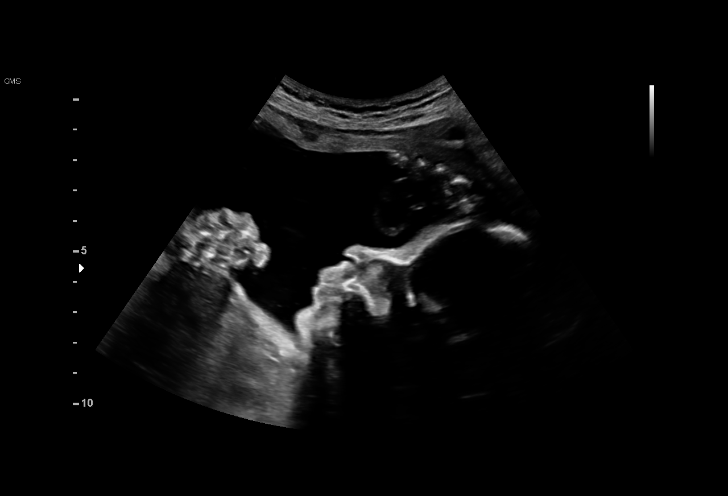

[13 of 28 positions shown; findings below may reference images not displayed]

JUCEVICIENE CNM

                                                       IAK
 ----------------------------------------------------------------------

 ----------------------------------------------------------------------
Indications

  Encounter for other antenatal screening
  follow-up
  History of cesarean delivery, currently
  pregnant
  Late to prenatal care, third trimester
  Tobacco use complicating pregnancy, third
  trimester
  Anemia during pregnancy in third trimester
  Anti-C isoimmunization affecting pregnancy
  in 3rd trimester
  Marginal insertion of umbilical cord affecting
  management of mother in third trimester
  28 weeks gestation of pregnancy
 ----------------------------------------------------------------------
Vital Signs

                                                Height:        5'2"
Fetal Evaluation

 Num Of Fetuses:         1
 Fetal Heart Rate(bpm):  132
 Cardiac Activity:       Observed
 Presentation:           Cephalic
 Placenta:               Posterior
 P. Cord Insertion:      Visualized, central

 Amniotic Fluid
 AFI FV:      Polyhydramnios

 AFI Sum(cm)     %Tile       Largest Pocket(cm)
 29.08           > 97
 RUQ(cm)       RLQ(cm)       LUQ(cm)        LLQ(cm)

Biometry

 BPD:      70.3  mm     G. Age:  28w 2d         32  %    CI:         70.6   %    70 - 86
                                                         FL/HC:      18.6   %    18.8 -
 HC:      266.7  mm     G. Age:  29w 0d         37  %    HC/AC:      1.14        1.05 -
 AC:      233.8  mm     G. Age:  27w 5d         23  %    FL/BPD:     70.4   %    71 - 87
 FL:       49.5  mm     G. Age:  26w 5d          4  %    FL/AC:      21.2   %    20 - 24
 HUM:      46.8  mm     G. Age:  27w 4d         27  %

 Est. FW:    9933  gm      2 lb 6 oz     33  %
OB History

 Gravidity:    4         Term:   3        Prem:   0        SAB:   0
 TOP:          0       Ectopic:  0        Living: 3
Gestational Age

 LMP:           28w 3d        Date:  12/11/17                 EDD:   09/17/18
 U/S Today:     28w 0d                                        EDD:   09/20/18
 Best:          28w 3d     Det. By:  LMP  (12/11/17)          EDD:   09/17/18
Anatomy

 Cranium:               Appears normal         Aortic Arch:            Previously seen
 Cavum:                 Appears normal         Ductal Arch:            Previously seen
 Ventricles:            Appears normal         Diaphragm:              Appears normal
 Choroid Plexus:        Appears normal         Stomach:                Appears normal, left
                                                                       sided
 Cerebellum:            Appears normal         Abdomen:                Appears normal
 Posterior Fossa:       Appears normal         Abdominal Wall:         Not well visualized
 Nuchal Fold:           Not applicable (>20    Cord Vessels:           Appears normal (3
                        wks GA)                                        vessel cord)
 Face:                  Appears normal         Kidneys:                Appear normal
                        (orbits and profile)
 Lips:                  Appears normal         Bladder:                Appears normal
 Thoracic:              Appears normal         Spine:                  Previously seen
 Heart:                 Appears normal         Upper Extremities:      Previously seen
                        (4CH, axis, and
                        situs)
 RVOT:                  Appears normal         Lower Extremities:      Previously seen
 LVOT:                  Appears normal

 Other:  Fetus appears to be a male. Heels and 5th digit previously visualized.
         Nasal bone visualized.
Doppler - Fetal Vessels

 Middle Cerebral Artery
                                                    PSV    MoM
                                                  (cm/s)
                                                      35   < 1

Cervix Uterus Adnexa
 Cervix
 Not visualized (advanced GA >39wks)

 Uterus
 No abnormality visualized.

 Left Ovary
 Within normal limits.

 Right Ovary
 Within normal limits.

 Cul De Sac
 No free fluid seen.

 Adnexa
 No abnormality visualized.
Impression

 Increased anti-c (small) titers ([DATE]). Patient returned for fetal
 growth assessment and Middle-cerebral artery (MCA)
 Doppler study. She missed her appointment last week.

 Fetal growth is appropriate for gestational age.
 Polyhydramnios is seen. Stomach and kidney appear normal.
 MCA Doppler study showed normal peak-systolic velocity
 measurements (no evidence of fetal anemia). No evidence of
 fetal hydrops. Placental cord insertion site appears normal.

 Patient reports she will be undergoing screening for
 gestational diabetes (GDM) today. I explained the
 significance of polyhydramnios (GDM or idiopathic).
Recommendations

 -MCA Doppler in 2 weeks and 4 weeks.
 -Fetal growth in 4 weeks.
                 Aujla, Danii

## 2020-02-01 IMAGING — US US MFM MCA DOPPLER
1 series · 10 of 10 positions shown · non-contrast
Comparison: none

[Series 1: us mfm mca doppler · 10 of 10 slices shown]
[im 1/10]
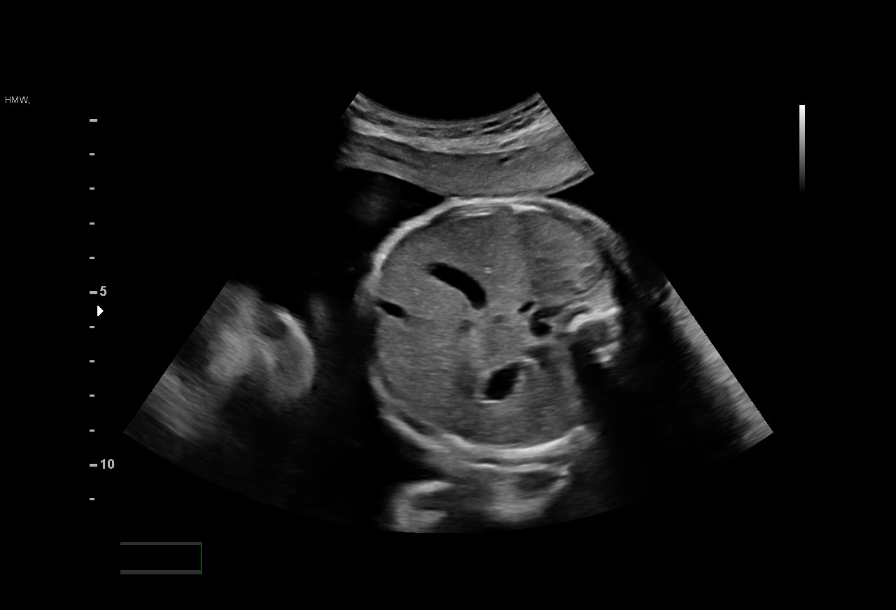
[im 2/10]
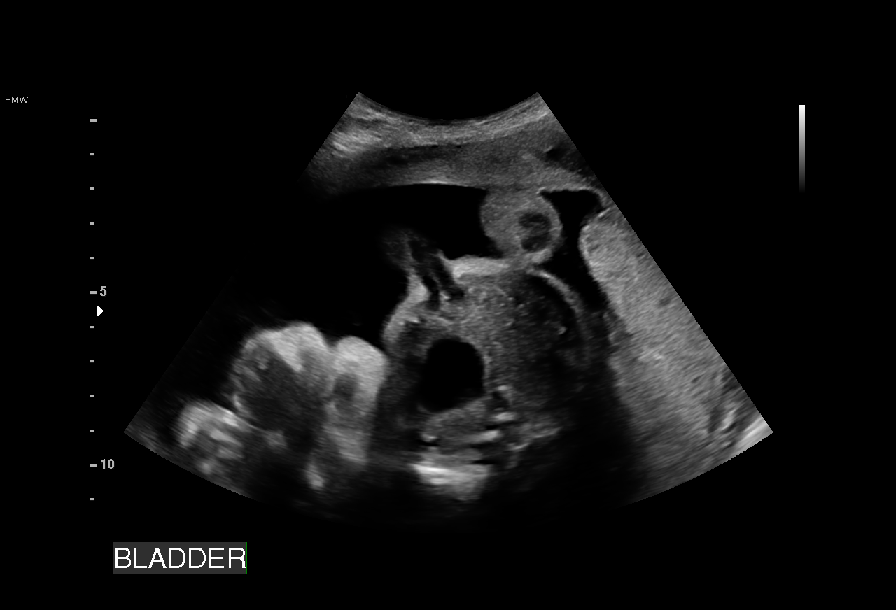
[im 3/10]
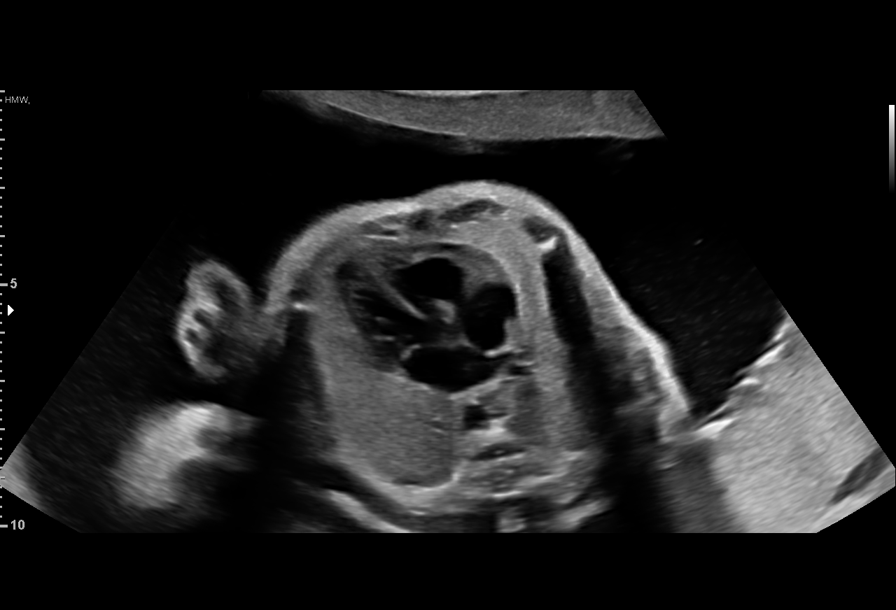
[im 4/10]
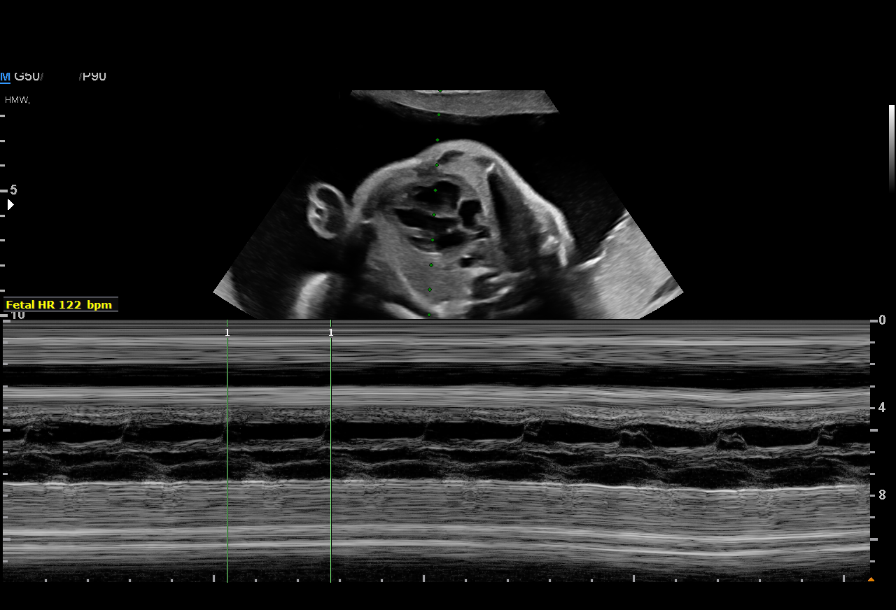
[im 5/10]
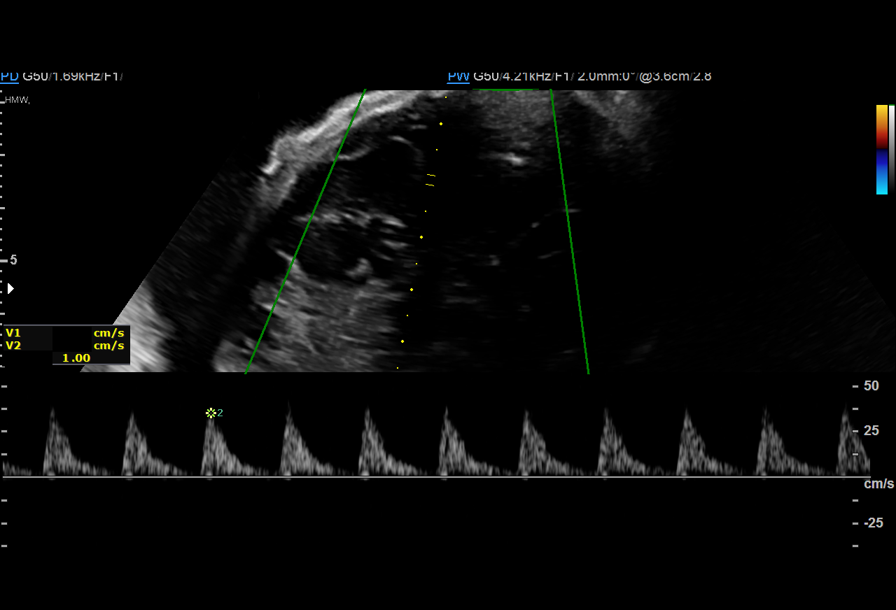
[im 6/10]
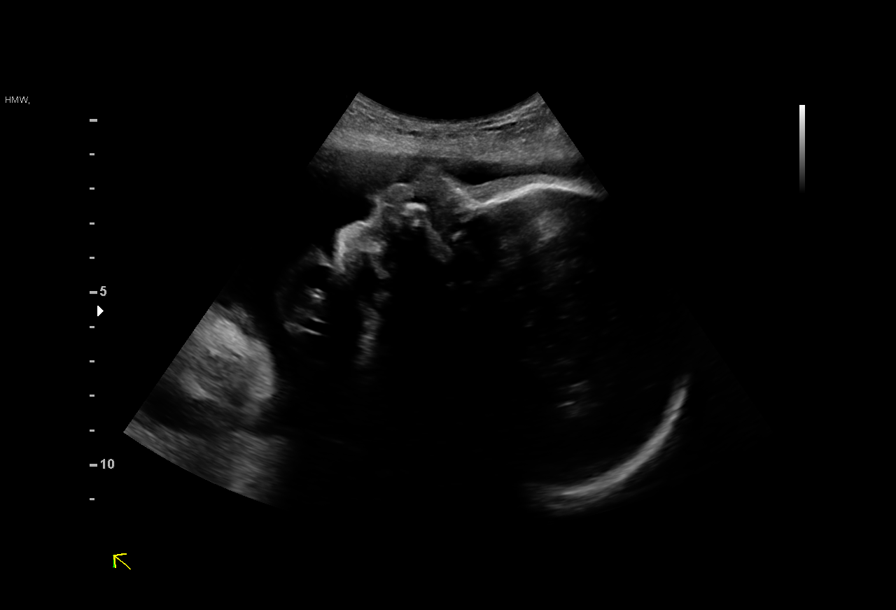
[im 7/10]
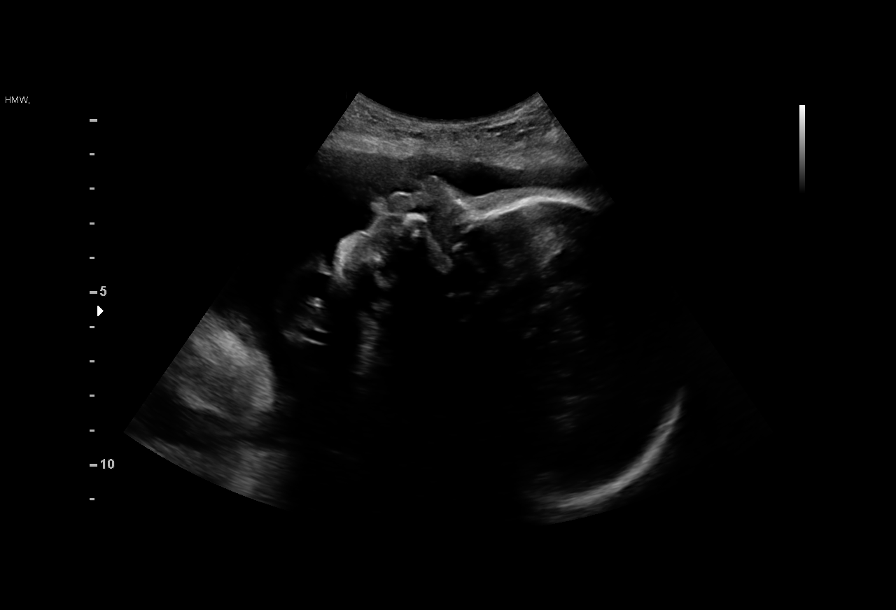
[im 8/10]
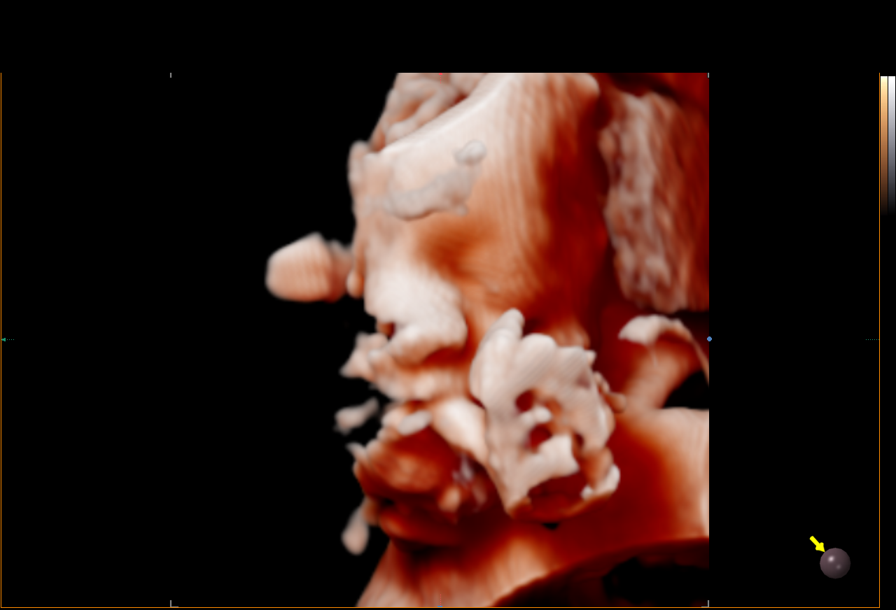
[im 9/10]
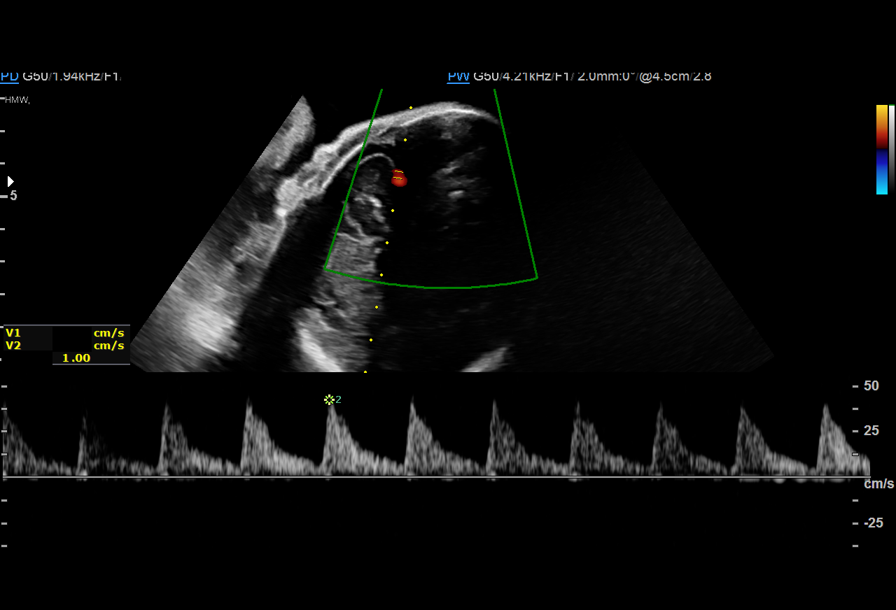
[im 10/10]
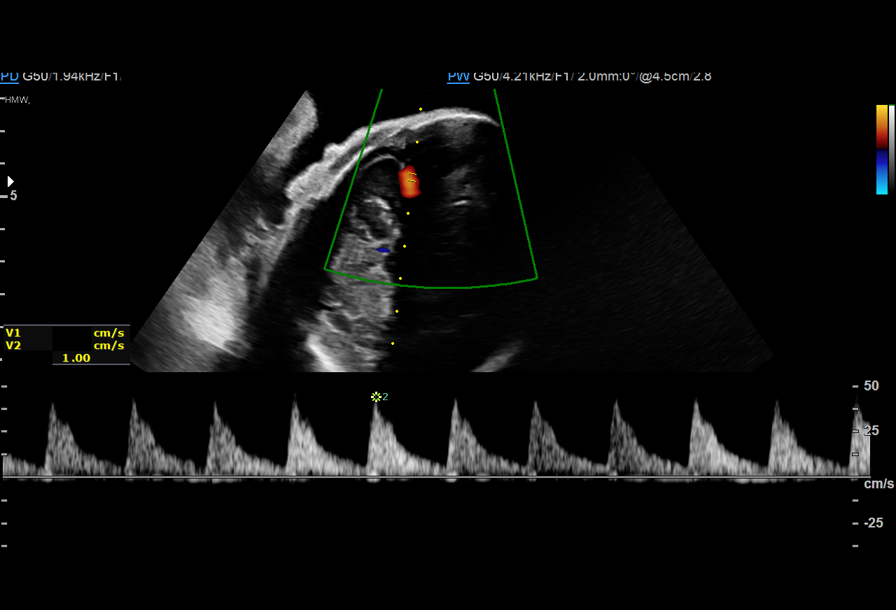

[10 of 10 positions shown; findings below may reference images not displayed]

CHAIAS CNM

 ----------------------------------------------------------------------

 ----------------------------------------------------------------------
Indications

  31 weeks gestation of pregnancy
  History of cesarean delivery, currently
  pregnant
  Late to prenatal care, third trimester
  Tobacco use complicating pregnancy, third
  trimester
  Anemia during pregnancy in third trimester
  Anti-C isoimmunization affecting pregnancy
  in 3rd trimester
 ----------------------------------------------------------------------
Vital Signs

                                                Height:        5'2"
Fetal Evaluation

 Num Of Fetuses:         1
 Fetal Heart Rate(bpm):  122
 Cardiac Activity:       Observed
 Presentation:           Cephalic
OB History

 Gravidity:    4         Term:   3        Prem:   0        SAB:   0
 TOP:          0       Ectopic:  0        Living: 3
Gestational Age

 LMP:           31w 1d        Date:  12/11/17                 EDD:   09/17/18
 Best:          31w 1d     Det. By:  LMP  (12/11/17)          EDD:   09/17/18
Anatomy
 Thoracic:              Appears normal         Bladder:                Appears normal
 Stomach:               Appears normal, left
                        sided
Doppler - Fetal Vessels

 Middle Cerebral Artery
                                                    PSV    MoM
                                                  (cm/s)


Cervix Uterus Adnexa

 Cervix
 Not visualized (advanced GA >64wks)
Impression

 ANTI-c screening with [DATE] titer
 MCA MoM 1.04 which is normal .
Recommendations

 Continue screening every  2 weeks.

## 2020-02-10 IMAGING — US US MFM MCA DOPPLER
1 series · 13 of 28 positions shown · non-contrast
Comparison: none

[Series 1: us mfm mca doppler · 63 acquisitions, 13 frames shown]
[im 3/63]
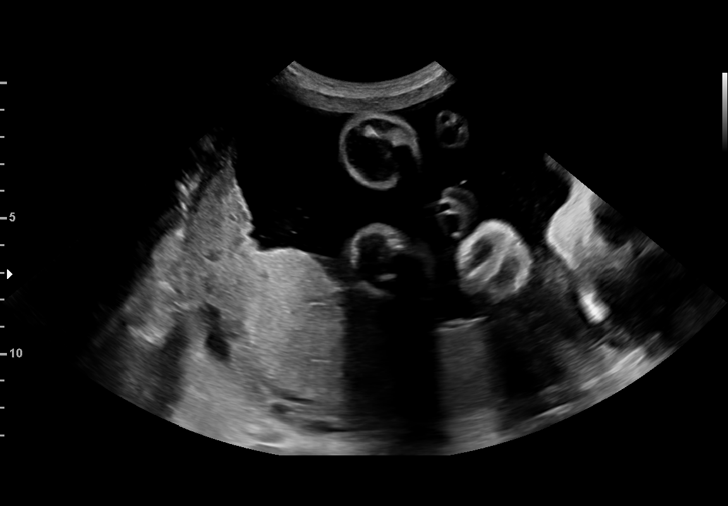
[im 7/63]
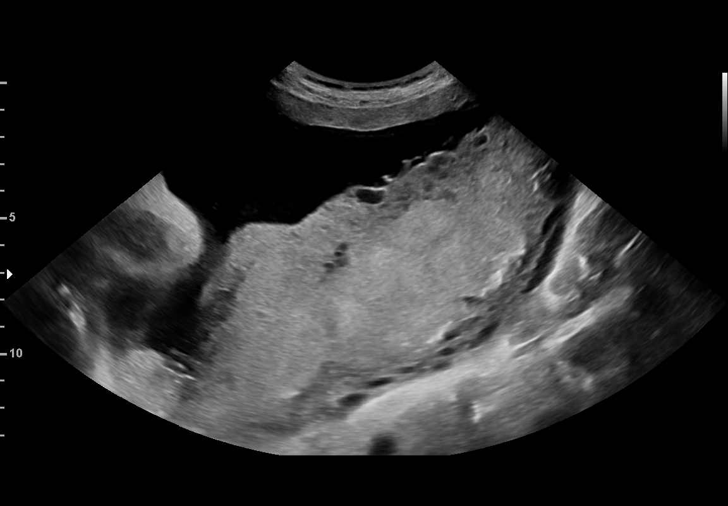
[im 12/63]
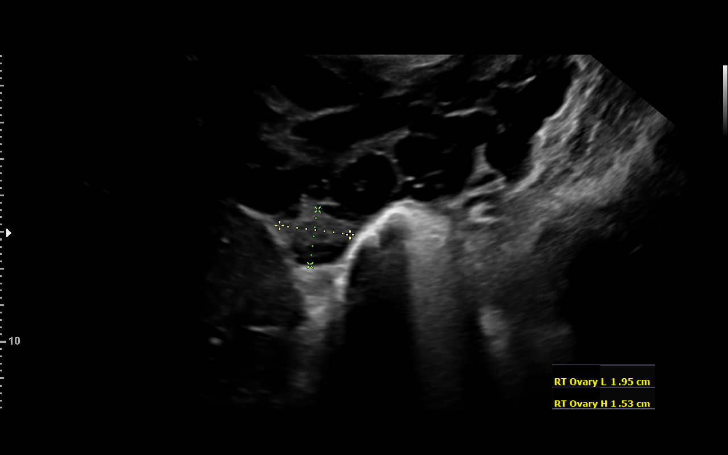
[im 17/63]
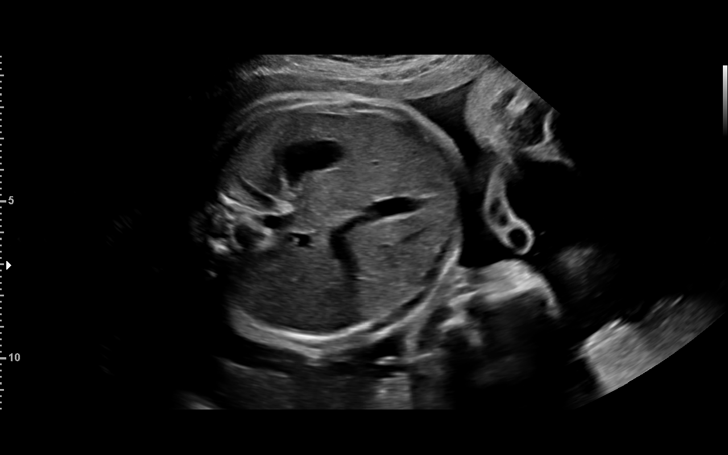
[im 21/63]
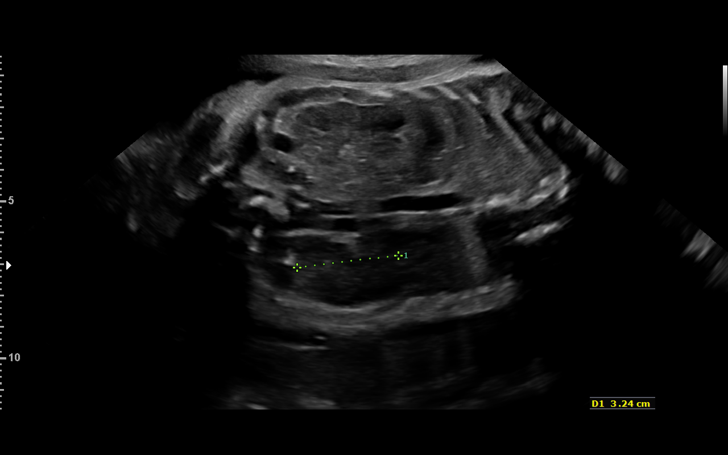
[im 26/63]
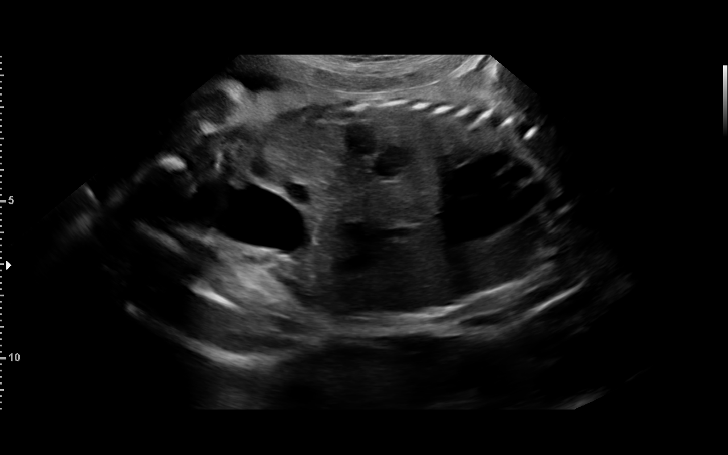
[im 33/63]
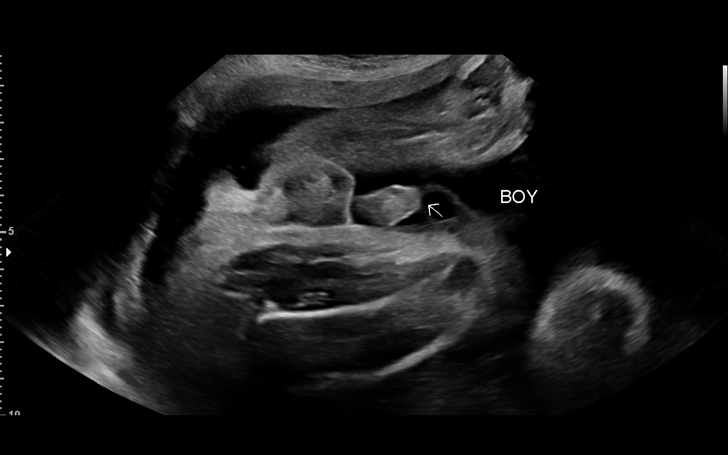
[im 37/63]
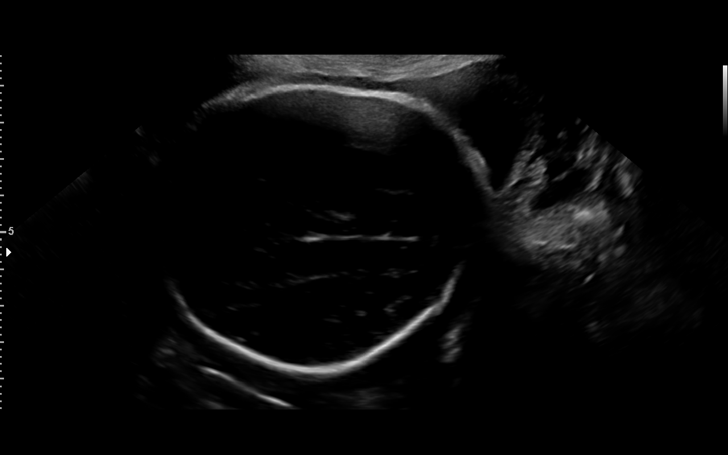
[im 42/63]
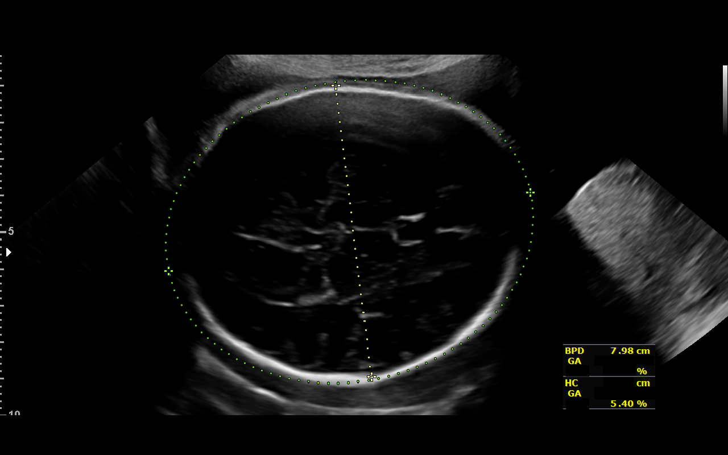
[im 46/63]
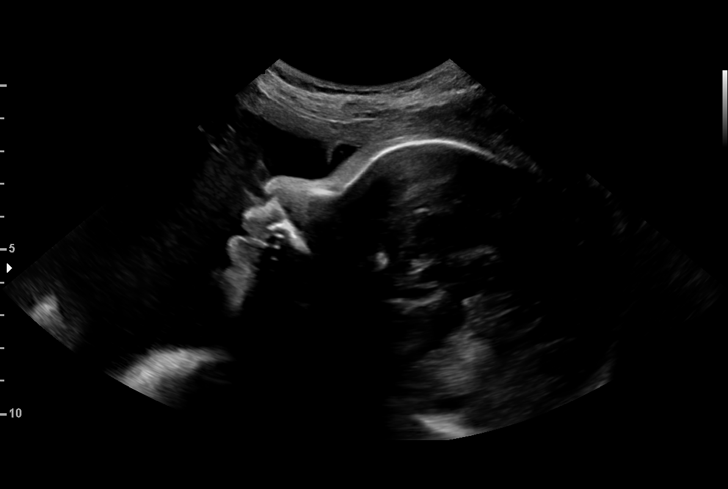
[im 51/63]
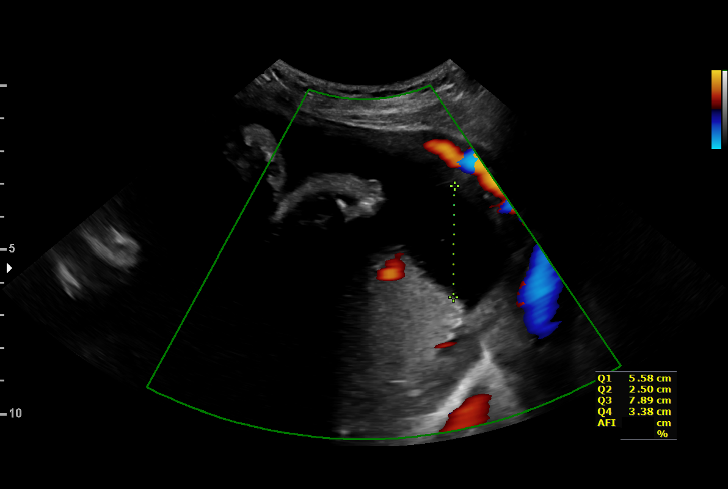
[im 56/63]
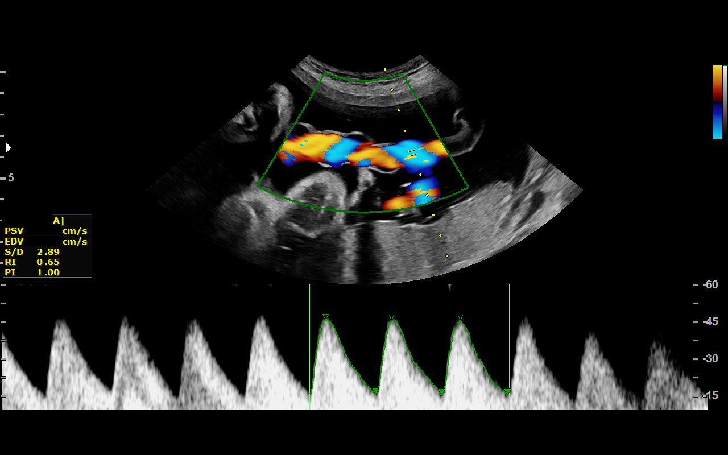
[im 60/63]
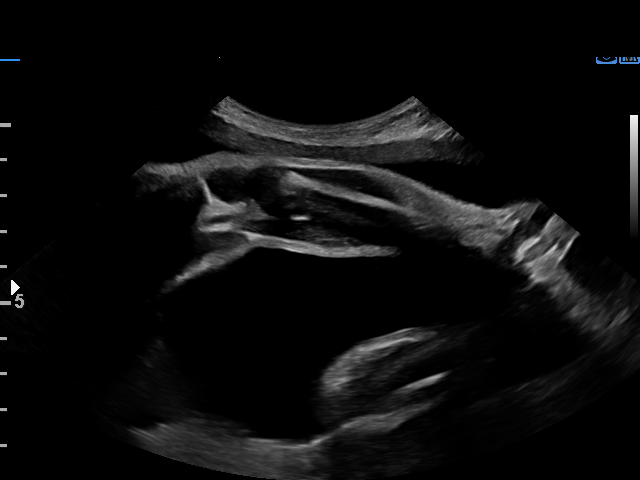

[13 of 28 positions shown; findings below may reference images not displayed]

TOMASCHETT CNM

 ----------------------------------------------------------------------

 ----------------------------------------------------------------------
Indications

  32 weeks gestation of pregnancy
  History of cesarean delivery, currently
  pregnant
  Late to prenatal care, third trimester
  Tobacco use complicating pregnancy, third
  trimester
  Anemia during pregnancy in third trimester
  Anti-C isoimmunization affecting pregnancy
  in 3rd trimester
  Maternal care for known or suspected poor
  fetal growth
 ----------------------------------------------------------------------
Vital Signs

 BMI:
Fetal Evaluation

 Num Of Fetuses:         1
 Preg. Location:         [id number]
 Gest. Sac:              [id number]
 Fetal Heart Rate(bpm):  137
 Cardiac Activity:       Observed
 Presentation:           Cephalic
 Placenta:               Posterior
 P. Cord Insertion:      Visualized, central
 Amniotic Fluid
 AFI FV:      Within normal limits

 AFI Sum(cm)     %Tile       Largest Pocket(cm)
 19.35           73

 RUQ(cm)       RLQ(cm)       LUQ(cm)        LLQ(cm)

Biophysical Evaluation

 Amniotic F.V:   Within normal limits       F. Tone:        Observed
 F. Movement:    Observed                   Score:          [DATE]
 F. Breathing:   Observed
Biometry

 BPD:      78.6  mm     G. Age:  31w 4d         18  %    CI:        74.65   %    70 - 86
                                                         FL/HC:      19.7   %    19.1 -
 HC:      288.7  mm     G. Age:  31w 5d          6  %    HC/AC:      1.07        0.96 -
 AC:      270.5  mm     G. Age:  31w 1d         15  %    FL/BPD:     72.5   %    71 - 87
 FL:         57  mm     G. Age:  29w 6d          1  %    FL/AC:      21.1   %    20 - 24
 LV:        4.4  mm

 Est. FW:    4444  gm    3 lb 10 oz       7  %
OB History

 Gravidity:    4         Term:   3        Prem:   0        SAB:   0
 TOP:          0       Ectopic:  0        Living: 3
Gestational Age

 LMP:           32w 3d        Date:  12/11/17                 EDD:   09/17/18
 U/S Today:     31w 1d                                        EDD:   09/26/18
 Best:          32w 3d     Det. By:  LMP  (12/11/17)          EDD:   09/17/18
Anatomy

 Cranium:               Appears normal         LVOT:                   Previously seen
 Cavum:                 Appears normal         Aortic Arch:            Previously seen
 Ventricles:            Appears normal         Ductal Arch:            Previously seen
 Choroid Plexus:        Appears normal         Diaphragm:              Appears normal
 Cerebellum:            Appears normal         Stomach:                Appears normal, left
                                                                       sided
 Posterior Fossa:       Appears normal         Abdomen:                Appears normal
 Nuchal Fold:           Not applicable (>20    Abdominal Wall:         Appears nml (cord
                        wks GA)                                        insert, abd wall)
 Face:                  Orbits previously      Cord Vessels:           Appears normal (3
                        seen, profile nl                               vessel cord)
 Lips:                  Previously seen        Kidneys:                Appear normal
 Palate:                Previously seen        Bladder:                Appears normal
 Thoracic:              Appears normal         Spine:                  Previously seen
 Heart:                 Appears normal         Upper Extremities:      Previously seen
                        (4CH, axis, and
                        situs)
 RVOT:                  Previously seen        Lower Extremities:      Previously seen

 Other:  Heels and 5th digit previously seen. Nasal bone visualized. Fetus
         appears to be a male.
Doppler - Fetal Vessels

 Umbilical Artery
  S/D     %tile     RI              PI                     ADFV    RDFV
 2.78       58   0.64             0.96                        No      No

 Middle Cerebral Artery
  S/D               RI              PI    %tile     PSV    MoM
                                                  (cm/s)
 8.02            0.88             2.[REDACTED]   < 1

 Cerebroplacental Ratio
                        MCA PI / UA PI    %tile
                                  2.17       35

Cervix Uterus Adnexa

 Cervix
 Not visualized (advanced GA >24wks)

 Left Ovary
 Not visualized.

 Right Ovary
 Within normal limits.

 Adnexa
 No abnormality visualized.
Impression

 Increased anti-c (small) antibodies ([DATE]).

 The estimated fetal weight is at the 7th percentile. Amniotic
 fluid is normal and good fetal activity is seen. Umbilical artery
 Doppler study is normal. Middle-cerebral artery Doppler
 showed normal peak systolic velocity (no evidence of fetal
 anemia). Antenatal testing is reassuring. BPP [DATE].

 I explained the findings suggestive of fetal growth restriction. I
 also reassured her of antenatal testing. If fetus is not severely
 growth restriction on follow-up scans, delivery can be
 considered between 38 to 40 weeks (preferably 39 weeks).
Recommendations

 -Weekly BPP, UA Doppler, MCA Doppler till delivery.
 -NST in 2 weeks and then weekly.
 -Fetal growth in 3 weeks.
                 Matinian, Kanikami
